# Patient Record
Sex: Male | Born: 1965
Health system: Southern US, Community
[De-identification: ages and names within clinical notes are randomized; demographics above are authoritative.]

## PROBLEM LIST (undated history)

## (undated) DIAGNOSIS — C8190 Hodgkin lymphoma, unspecified, unspecified site: Secondary | ICD-10-CM

## (undated) DIAGNOSIS — N200 Calculus of kidney: Secondary | ICD-10-CM

## (undated) DIAGNOSIS — I10 Essential (primary) hypertension: Secondary | ICD-10-CM

## (undated) HISTORY — PX: VASECTOMY: SHX75

## (undated) HISTORY — PX: BONE MARROW BIOPSY: SHX199

## (undated) HISTORY — DX: Calculus of kidney: N20.0

## (undated) HISTORY — DX: Hodgkin lymphoma, unspecified, unspecified site: C81.90

## (undated) HISTORY — DX: Essential (primary) hypertension: I10

---

## 2017-01-31 ENCOUNTER — Encounter: Payer: Self-pay | Admitting: *Deleted

## 2017-01-31 ENCOUNTER — Emergency Department
Admission: EM | Admit: 2017-01-31 | Discharge: 2017-02-01 | Disposition: A | Discharge status: Home / Self Care | Payer: Federal, State, Local not specified - PPO | Attending: Emergency Medicine | Admitting: Emergency Medicine

## 2017-01-31 DIAGNOSIS — N200 Calculus of kidney: Secondary | ICD-10-CM | POA: Insufficient documentation

## 2017-01-31 DIAGNOSIS — R1111 Vomiting without nausea: Secondary | ICD-10-CM | POA: Diagnosis not present

## 2017-01-31 DIAGNOSIS — R1032 Left lower quadrant pain: Secondary | ICD-10-CM | POA: Diagnosis present

## 2017-01-31 DIAGNOSIS — R109 Unspecified abdominal pain: Secondary | ICD-10-CM | POA: Diagnosis not present

## 2017-01-31 NOTE — ED Triage Notes (Signed)
Pt complains of left lower quad pain, pt denies fever , nausea and vomiting, pain started tonight, pt reports decrease in urine

## 2017-02-01 ENCOUNTER — Emergency Department: Payer: Federal, State, Local not specified - PPO

## 2017-02-01 DIAGNOSIS — R109 Unspecified abdominal pain: Secondary | ICD-10-CM | POA: Diagnosis not present

## 2017-02-01 DIAGNOSIS — R1111 Vomiting without nausea: Secondary | ICD-10-CM | POA: Diagnosis not present

## 2017-02-01 LAB — URINALYSIS, COMPLETE (UACMP) WITH MICROSCOPIC
BACTERIA UA: NONE SEEN
Bilirubin Urine: NEGATIVE
Glucose, UA: NEGATIVE mg/dL
Ketones, ur: NEGATIVE mg/dL
Leukocytes, UA: NEGATIVE
NITRITE: NEGATIVE
PROTEIN: NEGATIVE mg/dL
Specific Gravity, Urine: 1.018 (ref 1.005–1.030)
Squamous Epithelial / LPF: NONE SEEN
pH: 6 (ref 5.0–8.0)

## 2017-02-01 LAB — COMPREHENSIVE METABOLIC PANEL
ALBUMIN: 4.2 g/dL (ref 3.5–5.0)
ALT: 41 U/L (ref 17–63)
AST: 38 U/L (ref 15–41)
Alkaline Phosphatase: 59 U/L (ref 38–126)
Anion gap: 12 (ref 5–15)
BUN: 24 mg/dL — AB (ref 6–20)
CHLORIDE: 104 mmol/L (ref 101–111)
CO2: 23 mmol/L (ref 22–32)
CREATININE: 1.45 mg/dL — AB (ref 0.61–1.24)
Calcium: 9.3 mg/dL (ref 8.9–10.3)
GFR calc non Af Amer: 54 mL/min — ABNORMAL LOW (ref >60)
GLUCOSE: 144 mg/dL — AB (ref 65–99)
Potassium: 3.2 mmol/L — ABNORMAL LOW (ref 3.5–5.1)
SODIUM: 139 mmol/L (ref 135–145)
Total Bilirubin: 0.4 mg/dL (ref 0.3–1.2)
Total Protein: 7.2 g/dL (ref 6.5–8.1)

## 2017-02-01 LAB — CBC
HEMATOCRIT: 38.4 % — AB (ref 40.0–52.0)
Hemoglobin: 13.3 g/dL (ref 13.0–18.0)
MCH: 32.9 pg (ref 26.0–34.0)
MCHC: 34.6 g/dL (ref 32.0–36.0)
MCV: 95.2 fL (ref 80.0–100.0)
PLATELETS: 314 10*3/uL (ref 150–440)
RBC: 4.04 MIL/uL — ABNORMAL LOW (ref 4.40–5.90)
RDW: 12.6 % (ref 11.5–14.5)
WBC: 11.7 10*3/uL — AB (ref 3.8–10.6)

## 2017-02-01 LAB — LIPASE, BLOOD: Lipase: 36 U/L (ref 11–51)

## 2017-02-01 MED ORDER — MORPHINE SULFATE (PF) 4 MG/ML IV SOLN
4.0000 mg | Freq: Once | INTRAVENOUS | Status: AC
  Administered 2017-02-01: 4 mg via INTRAVENOUS
  Filled 2017-02-01: qty 1

## 2017-02-01 MED ORDER — ONDANSETRON 4 MG PO TBDP: 4.0000 mg | ORAL_TABLET | Freq: Three times a day (TID) | ORAL | 0 refills | Status: DC | PRN

## 2017-02-01 MED ORDER — KETOROLAC TROMETHAMINE 30 MG/ML IJ SOLN
30.0000 mg | Freq: Once | INTRAMUSCULAR | Status: AC
  Administered 2017-02-01: 30 mg via INTRAVENOUS
  Filled 2017-02-01: qty 1

## 2017-02-01 MED ORDER — ONDANSETRON HCL 4 MG/2ML IJ SOLN
4.0000 mg | Freq: Once | INTRAMUSCULAR | Status: AC
  Administered 2017-02-01: 4 mg via INTRAVENOUS
  Filled 2017-02-01: qty 2

## 2017-02-01 MED ORDER — OXYCODONE-ACETAMINOPHEN 5-325 MG PO TABS: 1.0000 | ORAL_TABLET | ORAL | 0 refills | Status: DC | PRN

## 2017-02-01 NOTE — ED Notes (Signed)

## 2017-02-01 NOTE — ED Notes (Signed)
Lab called for another recollect on lavender top. D/t insufficient samples from ED x2, this RN requested lab draw sample. Lab states will come draw.

## 2017-02-01 NOTE — ED Notes (Signed)
Pt to CT at this time.

## 2017-02-01 NOTE — ED Notes (Signed)
Unable to print label, lab called and per [place] ok to send chart label

## 2017-02-01 NOTE — ED Provider Notes (Signed)
[facility] Emergency Department Provider Note    First MD Initiated Contact with Patient 01/31/17 2348     (approximate)  I have reviewed the triage vital signs and the nursing notes.   HISTORY  Chief Complaint Abdominal Pain   HPI James Shaffer is a 51 y.o. male presents with acute onset of left lower quadrant abdominal pain associated with nausea and vomiting which began approximately an hour before presentation to the emergency department. Patient states that he's noted decreased urinary output however no dysuria no hematuria here patient denies any fever. Patient denies any diarrhea or constipation. She states his current pain score is 10 out of 10. Patient denies any history of kidney stones or diverticulitis.   Past medical history None There are no active problems to display for this patient.   Past surgical history:  None  Prior to Admission medications   Not on File    Allergies Patient has no known allergies.  No family history on file.  Social History Social History  Substance Use Topics  . Smoking status: Never Smoker  . Smokeless tobacco: Not on file  . Alcohol use 16.8 oz/week    28 Shots of liquor per week    Review of Systems Constitutional: No fever/chills Eyes: No visual changes. ENT: No sore throat. Cardiovascular: Denies chest pain. Respiratory: Denies shortness of breath. Gastrointestinal: No abdominal pain.  No nausea, no vomiting.  No diarrhea.  No constipation. Genitourinary: Negative for dysuria. Musculoskeletal: Negative for neck pain.  Negative for back pain. Integumentary: Negative for rash. Neurological: Negative for headaches, focal weakness or numbness.  ____________________________________________   PHYSICAL EXAM:  VITAL SIGNS: ED Triage Vitals  Enc Vitals Group     BP 01/31/17 2335 (!) 150/83     Pulse Rate 01/31/17 2335 63     Resp 01/31/17 2335 (!) 22     Temp 01/31/17 2335 97.6 F (36.4  C)     Temp Source 01/31/17 2335 Oral     SpO2 01/31/17 2335 100 %     Weight 01/31/17 2324 72.6 kg (160 lb)     Height 01/31/17 2324 1.753 m (5\' 9" )     Head Circumference --      Peak Flow --      Pain Score 01/31/17 2324 10     Pain Loc --      Pain Edu? --      Excl. in [place]? --     Constitutional: Alert and oriented. Apparent discomfort Eyes: Conjunctivae are normal. Head: Atraumatic. Mouth/Throat: Mucous membranes are moist.  Oropharynx non-erythematous. Neck: No stridor.   Cardiovascular: Normal rate, regular rhythm. Good peripheral circulation. Grossly normal heart sounds. Respiratory: Normal respiratory effort.  No retractions. Lungs CTAB. Gastrointestinal: Soft and nontender. No distention.  Musculoskeletal: No lower extremity tenderness nor edema. No gross deformities of extremities. Neurologic:  Normal speech and language. No gross focal neurologic deficits are appreciated.  Skin:  Skin is warm, dry and intact. No rash noted. Psychiatric: Mood and affect are normal. Speech and behavior are normal.  ____________________________________________   LABS (all labs ordered are listed, but only abnormal results are displayed)  Labs Reviewed  COMPREHENSIVE METABOLIC PANEL - Abnormal; Notable for the following:       Result Value   Potassium 3.2 (*)    Glucose, Bld 144 (*)    BUN 24 (*)    Creatinine, Ser 1.45 (*)    GFR calc non Af Amer 54 (*)  All other components within normal limits  URINALYSIS, COMPLETE (UACMP) WITH MICROSCOPIC - Abnormal; Notable for the following:    Color, Urine YELLOW (*)    APPearance CLEAR (*)    Hgb urine dipstick MODERATE (*)    All other components within normal limits  LIPASE, BLOOD  CBC     RADIOLOGY I, Trilby N BROWN, personally viewed and evaluated these images (plain radiographs) as part of my medical decision making, as well as reviewing the written report by the radiologist.  Ct Renal Stone Study  Result Date:  02/01/2017 CLINICAL DATA:  Left flank pain.  Nausea and vomiting. EXAM: CT ABDOMEN AND PELVIS WITHOUT CONTRAST TECHNIQUE: Multidetector CT imaging of the abdomen and pelvis was performed following the standard protocol without IV contrast. COMPARISON:  None. FINDINGS: Lower chest: The lung bases are clear. Hepatobiliary: No focal liver abnormality is seen. No gallstones, gallbladder wall thickening, or biliary dilatation. Pancreas: To small low-density lesions in the distal body and tail measure 4 and 5 mm respectively, too small to characterize. No ductal dilatation or inflammation. Spleen: Normal in size without focal abnormality. Adrenals/Urinary Tract: Obstructing 2 x 3 mm stone at the left ureterovesicular junction with mild to moderate hydroureteronephrosis. Mild left perinephric edema. There are least 4 nonobstructing stones in the left kidney. No right hydronephrosis. There are least 6 nonobstructing stones in the right kidney. Right ureter is decompressed. Urinary bladder is decompressed. Normal adrenal glands. Stomach/Bowel: Very minimal sigmoid colonic diverticulosis. No acute inflammation. Normal appendix. Small hiatal hernia. Vascular/Lymphatic: Trace aortic atherosclerosis without aneurysm. Circumaortic left renal vein. No adenopathy. Reproductive: Prostate is unremarkable. Other: No free air, free fluid, or intra-abdominal fluid collection. Musculoskeletal: There are no acute or suspicious osseous abnormalities. IMPRESSION: 1. Obstructing 2 x 3 mm stone at the left ureterovesicular junction with mild to moderate hydronephrosis. 2. Multiple bilateral nonobstructing renal calculi. 3. Minimal colonic diverticulosis.  Mild aortic atherosclerosis. Electronically Signed   By: Jeb Levering M.D.   On: 02/01/2017 00:57      Procedures   ____________________________________________   INITIAL IMPRESSION / ASSESSMENT AND PLAN / ED COURSE  Pertinent labs & imaging results that were available during  my care of the patient were reviewed by me and considered in my medical decision making (see chart for details).  51 year old male presenting with left lower quadrant abdominal pain that was not reproducible on exam. Given history and physical exam concern for possible ureterolithiasis which is confirmed with CT scan. Patient received IV morphine 4 mg on arrival to the emergency department as well as Zofran 4 mg with improvement of pain however not complete resolution. Following CT scan findings patient given IV Toradol 30 mg of complete resolution of pain.      ____________________________________________  FINAL CLINICAL IMPRESSION(S) / ED DIAGNOSES  Final diagnoses:  Kidney stone on left side     MEDICATIONS GIVEN DURING THIS VISIT:  Medications  morphine 4 MG/ML injection 4 mg (4 mg Intravenous Given 02/01/17 0019)  ondansetron (ZOFRAN) injection 4 mg (4 mg Intravenous Given 02/01/17 0019)  ketorolac (TORADOL) 30 MG/ML injection 30 mg (30 mg Intravenous Given 02/01/17 0112)     NEW OUTPATIENT MEDICATIONS STARTED DURING THIS VISIT:  New Prescriptions   No medications on file    Modified Medications   No medications on file    Discontinued Medications   No medications on file     Note:  This document was prepared using Dragon voice recognition software and may include unintentional dictation errors.  Gregor Hams, MD 02/01/17 [phone]

## 2017-02-18 NOTE — Progress Notes (Signed)
02/19/2017 2:35 PM   James Shaffer [birth year] [id number]  Referring provider: No referring provider defined for this encounter.  Chief Complaint  Patient presents with  . New Patient (Initial Visit)    ER referral for kidney stones    HPI: Patient is a 51 year old Caucasian male who [facility].  Patient states the onset of the pain was two weeks ago.   It was sharp.  It lasted for 30 minutes.  The pain was located left lower quadrant with no radiation.    The pain was a 10/10. Nothing made the pain better.   Nothing made the pain worse.  He did not have gross hematuria, fevers, chills, nausea or vomiting.    In the ED, he received Toradol, Zofran and morphine.  His UA noted TNTC RBC's.  His serum creatinine 1.45.  Prior serum creatinine not available.  WBC count was 11.7.    CT Renal stone study performed on 02/01/2017 in the ED noted an obstructing 2 x 3 mm stone at the left ureterovesicular junction with mild to moderate hydronephrosis.  Multiple bilateral nonobstructing renal calculi.   Minimal colonic diverticulosis.  Mild aortic atherosclerosis.  I have independently reviewed the films.  Today, he is having nocturia.  He passed a stone two days after the ED visit.  He has been having symptoms of GERD and HA's.  UA today is unremarkable.  He denies any further flank pain. He denies gross hematuria, dysuria and suprapubic pain. He has not had any recent fevers, chills, nausea or vomiting.Marland Kitchen  He does not have a prior history of stones.      PMH: Past Medical History:  Diagnosis Date  . Hodgkin's lymphoma [facility])     Surgical History: Past Surgical History:  Procedure Laterality Date  . BONE MARROW BIOPSY     hodgkins lymphoma  . VASECTOMY      Home Medications:  Allergies as of 02/19/2017   No Known Allergies     Medication List       Accurate as of 02/19/17  2:35 PM. Always use your most recent med list.            ibuprofen 200 MG tablet Commonly known as:  ADVIL,MOTRIN Take 200 mg by mouth every 6 (six) hours as needed.   omeprazole 40 MG capsule Commonly known as:  PRILOSEC Take 40 mg by mouth daily.   ondansetron 4 MG disintegrating tablet Commonly known as:  ZOFRAN ODT Take 1 tablet (4 mg total) by mouth every 8 (eight) hours as needed for nausea or vomiting.   OVER THE COUNTER MEDICATION Robitussin DM   oxyCODONE-acetaminophen 5-325 MG tablet Commonly known as:  ROXICET Take 1 tablet by mouth every 4 (four) hours as needed for severe pain.       Allergies: No Known Allergies  Family History: Family History  Problem Relation Age of Onset  . Kidney cancer Neg Hx   . Bladder Cancer Neg Hx   . Prostate cancer Neg Hx     Social History:  reports that he has quit smoking. He quit after 1.00 year of use. He has never used smokeless tobacco. He reports that he drinks about 16.8 oz of alcohol per week . He reports that he does not use drugs.  ROS: UROLOGY Frequent Urination?: No Hard to postpone urination?: No Burning/pain with urination?: No Get up at night to urinate?: Yes Leakage of urine?: No Urine stream starts and  stops?: No Trouble starting stream?: No Do you have to strain to urinate?: No Blood in urine?: No Urinary tract infection?: No Sexually transmitted disease?: No Injury to kidneys or bladder?: No Painful intercourse?: No Weak stream?: No Erection problems?: No Penile pain?: No  Gastrointestinal Nausea?: Yes Vomiting?: No Indigestion/heartburn?: Yes Diarrhea?: No Constipation?: No  Constitutional Fever: No Night sweats?: Yes Weight loss?: No Fatigue?: Yes  Skin Skin rash/lesions?: No Itching?: No  Eyes Blurred vision?: No Double vision?: No  Ears/Nose/Throat Sore throat?: Yes Sinus problems?: No  Hematologic/Lymphatic Swollen glands?: No Easy bruising?: No  Cardiovascular Leg swelling?: No Chest pain?: No  Respiratory Cough?:  Yes Shortness of breath?: No  Endocrine Excessive thirst?: No  Musculoskeletal Back pain?: No Joint pain?: No  Neurological Headaches?: Yes Dizziness?: No  Psychologic Depression?: No Anxiety?: No  Physical Exam: BP (!) 139/97   Pulse 92   Ht _0  (1.753 m)   Wt 161 lb 6.4 oz (73.2 kg)   BMI 23.83 kg/m   Constitutional: Well nourished. Alert and oriented, No acute distress. HEENT: [place] AT, moist mucus membranes. Trachea midline, no masses. Cardiovascular: No clubbing, cyanosis, or edema. Respiratory: Normal respiratory effort, no increased work of breathing. GI: Abdomen is soft, non tender, non distended, no abdominal masses. Liver and spleen not palpable.  No hernias appreciated.  Stool sample for occult testing is not indicated.   GU: No CVA tenderness.  No bladder fullness or masses.  Patient with circumcised phallus.  Urethral meatus is patent.  No penile discharge. No penile lesions or rashes. Scrotum without lesions, cysts, rashes and/or edema.  Testicles are located scrotally bilaterally. No masses are appreciated in the testicles. Left and right epididymis are normal. Rectal: Patient with  normal sphincter tone. Anus and perineum without scarring or rashes. No rectal masses are appreciated. Prostate is approximately 45 grams, no nodules are appreciated. Seminal vesicles are normal. Skin: No rashes, bruises or suspicious lesions. Lymph: No cervical or inguinal adenopathy. Neurologic: Grossly intact, no focal deficits, moving all 4 extremities. Psychiatric: Normal mood and affect.  Laboratory Data: Lab Results  Component Value Date   WBC 11.7 (H) 02/01/2017   HGB 13.3 02/01/2017   HCT 38.4 (L) 02/01/2017   MCV 95.2 02/01/2017   PLT 314 02/01/2017    Lab Results  Component Value Date   CREATININE 1.45 (H) 01/31/2017    Lab Results  Component Value Date   AST 38 01/31/2017   Lab Results  Component Value Date   ALT 41 01/31/2017      Urinalysis Unremarkable.  See EPIC.    Pertinent Imaging: CLINICAL DATA:  Left flank pain.  Nausea and vomiting.  EXAM: CT ABDOMEN AND PELVIS WITHOUT CONTRAST  TECHNIQUE: Multidetector CT imaging of the abdomen and pelvis was performed following the standard protocol without IV contrast.  COMPARISON:  None.  FINDINGS: Lower chest: The lung bases are clear.  Hepatobiliary: No focal liver abnormality is seen. No gallstones, gallbladder wall thickening, or biliary dilatation.  Pancreas: To small low-density lesions in the distal body and tail measure 4 and 5 mm respectively, too small to characterize. No ductal dilatation or inflammation.  Spleen: Normal in size without focal abnormality.  Adrenals/Urinary Tract: Obstructing 2 x 3 mm stone at the left ureterovesicular junction with mild to moderate hydroureteronephrosis. Mild left perinephric edema. There are least 4 nonobstructing stones in the left kidney. No right hydronephrosis. There are least 6 nonobstructing stones in the right kidney. Right ureter is decompressed. Urinary bladder  is decompressed. Normal adrenal glands.  Stomach/Bowel: Very minimal sigmoid colonic diverticulosis. No acute inflammation. Normal appendix. Small hiatal hernia.  Vascular/Lymphatic: Trace aortic atherosclerosis without aneurysm. Circumaortic left renal vein. No adenopathy.  Reproductive: Prostate is unremarkable.  Other: No free air, free fluid, or intra-abdominal fluid collection.  Musculoskeletal: There are no acute or suspicious osseous abnormalities.  IMPRESSION: 1. Obstructing 2 x 3 mm stone at the left ureterovesicular junction with mild to moderate hydronephrosis. 2. Multiple bilateral nonobstructing renal calculi. 3. Minimal colonic diverticulosis.  Mild aortic atherosclerosis.   Electronically Signed   By: Jeb Levering M.D.   On: 02/01/2017 00:57  Assessment & Plan:    1. Left ureteral  stone  - stone fragments sent for analysis - UA is unremarkable at today's visit  - Advised to contact our office or seek treatment in the ED if becomes febrile or pain/ vomiting are difficult control in order to arrange for emergent/urgent intervention  2. Left hydronephrosis  - obtain RUS to ensure the hydronephrosis has resolved  3. Microscopic hematuria  - UA today demonstrates no hematuria  - continue to monitor the patient's UA after the treatment/passage of the stone to ensure the hematuria has resolved  - if hematuria persists, we will pursue a hematuria workup with CT Urogram and cystoscopy if appropriate.  4. Bilateral nephrolithiasis  - no flank pain today  - offer 24 hour urine study once hydronephrosis has resolved    Return for RUS report.  These notes generated with voice recognition software. I apologize for typographical errors.  Zara Council, [facility] [address], [place], [place] [address] [phone]

## 2017-02-19 ENCOUNTER — Encounter: Payer: Self-pay | Admitting: Urology

## 2017-02-19 ENCOUNTER — Ambulatory Visit (INDEPENDENT_AMBULATORY_CARE_PROVIDER_SITE_OTHER): Payer: Federal, State, Local not specified - PPO | Admitting: Urology

## 2017-02-19 DIAGNOSIS — N201 Calculus of ureter: Secondary | ICD-10-CM

## 2017-02-19 DIAGNOSIS — N200 Calculus of kidney: Secondary | ICD-10-CM

## 2017-02-19 DIAGNOSIS — N132 Hydronephrosis with renal and ureteral calculous obstruction: Secondary | ICD-10-CM

## 2017-02-19 DIAGNOSIS — R3129 Other microscopic hematuria: Secondary | ICD-10-CM

## 2017-02-19 LAB — URINALYSIS, COMPLETE
Bilirubin, UA: NEGATIVE
Glucose, UA: NEGATIVE
LEUKOCYTES UA: NEGATIVE
Nitrite, UA: NEGATIVE
PH UA: 5.5 (ref 5.0–7.5)
RBC, UA: NEGATIVE
Specific Gravity, UA: 1.02 (ref 1.005–1.030)
Urobilinogen, Ur: 1 mg/dL (ref 0.2–1.0)

## 2017-02-22 LAB — CULTURE, URINE COMPREHENSIVE

## 2017-03-05 ENCOUNTER — Ambulatory Visit
Admission: RE | Admit: 2017-03-05 | Discharge: 2017-03-05 | Disposition: A | Discharge status: Home / Self Care | Payer: Federal, State, Local not specified - PPO | Source: Ambulatory Visit | Attending: Urology | Admitting: Urology

## 2017-03-05 DIAGNOSIS — N200 Calculus of kidney: Secondary | ICD-10-CM | POA: Insufficient documentation

## 2017-03-05 DIAGNOSIS — N132 Hydronephrosis with renal and ureteral calculous obstruction: Secondary | ICD-10-CM

## 2017-03-13 NOTE — Progress Notes (Signed)
03/14/2017 1:41 PM   James Shaffer May 31, 1966 [id number]  Referring provider: No referring provider defined for this encounter.  Chief Complaint  Patient presents with  . Nephrolithiasis    RUS results    HPI: [name] who presents today to review his renal ultrasound.  Background history Patient is a 51 year old Caucasian male who is referred by [facility] ED for nephrolithiasis.  Patient states the onset of the pain was two weeks ago.   It was sharp.  It lasted for 30 minutes.  The pain was located left lower quadrant with no radiation.  The pain was a 10/10. Nothing made the pain better.   Nothing made the pain worse.  He did not have gross hematuria, fevers, chills, nausea or vomiting.  In the ED, he received Toradol, Zofran and morphine.  His UA noted TNTC RBC's.  His serum creatinine 1.45.  Prior serum creatinine not available.  WBC count was 11.7.  CT Renal stone study performed on 02/01/2017 in the ED noted an obstructing 2 x 3 mm stone at the left ureterovesicular junction with mild to moderate hydronephrosis.  Multiple bilateral nonobstructing renal calculi.   Minimal colonic diverticulosis.  Mild aortic atherosclerosis.  I have independently reviewed the films.  Today, he is having nocturia.  He passed a stone two days after the ED visit.  He has been having symptoms of GERD and HA's.  UA today is unremarkable.  He denies any further flank pain. He denies gross hematuria, dysuria and suprapubic pain. He has not had any recent fevers, chills, nausea or vomiting.  He does not have a prior history of stones.    RUS completed on 03/05/2017 noted no left hydronephrosis is visualized  Multiple echogenic foci within the kidneys consistent with intrarenal calculi.  Today, he is complaining of nocturia and feelings of incomplete emptying. He is not having flank pain, gross hematuria or suprapubic pain. He is not experiencing dysuria, fever, chills, nausea or vomiting.  His UA today  demonstrates 3-10 RBC's.     PMH: Past Medical History:  Diagnosis Date  . Hodgkin's lymphoma [facility])     Surgical History: Past Surgical History:  Procedure Laterality Date  . BONE MARROW BIOPSY     hodgkins lymphoma  . VASECTOMY      Home Medications:  Allergies as of 03/14/2017   No Known Allergies     Medication List       Accurate as of 03/14/17  1:41 PM. Always use your most recent med list.          ibuprofen 200 MG tablet Commonly known as:  ADVIL,MOTRIN Take 200 mg by mouth every 6 (six) hours as needed.   omeprazole 40 MG capsule Commonly known as:  PRILOSEC Take 40 mg by mouth daily.   ondansetron 4 MG disintegrating tablet Commonly known as:  ZOFRAN ODT Take 1 tablet (4 mg total) by mouth every 8 (eight) hours as needed for nausea or vomiting.   OVER THE COUNTER MEDICATION Robitussin DM   oxyCODONE-acetaminophen 5-325 MG tablet Commonly known as:  ROXICET Take 1 tablet by mouth every 4 (four) hours as needed for severe pain.       Allergies: No Known Allergies  Family History: Family History  Problem Relation Age of Onset  . Kidney cancer Neg Hx   . Bladder Cancer Neg Hx   . Prostate cancer Neg Hx     Social History:  reports that he has quit  smoking. He quit after 1.00 year of use. He has never used smokeless tobacco. He reports that he drinks about 16.8 oz of alcohol per week . He reports that he does not use drugs.  ROS: UROLOGY Frequent Urination?: No Hard to postpone urination?: No Burning/pain with urination?: No Get up at night to urinate?: Yes Leakage of urine?: No Urine stream starts and stops?: No Trouble starting stream?: No Do you have to strain to urinate?: No Blood in urine?: No Urinary tract infection?: No Sexually transmitted disease?: No Injury to kidneys or bladder?: No Painful intercourse?: No Weak stream?: No Erection problems?: No Penile pain?: No  Gastrointestinal Nausea?: No Vomiting?:  No Indigestion/heartburn?: No Diarrhea?: No Constipation?: No  Constitutional Fever: No Night sweats?: No Weight loss?: No Fatigue?: Yes  Skin Skin rash/lesions?: No Itching?: No  Eyes Blurred vision?: No Double vision?: No  Ears/Nose/Throat Sore throat?: No Sinus problems?: No  Hematologic/Lymphatic Swollen glands?: No Easy bruising?: No  Cardiovascular Leg swelling?: No Chest pain?: No  Respiratory Cough?: No Shortness of breath?: No  Endocrine Excessive thirst?: No  Musculoskeletal Back pain?: No Joint pain?: No  Neurological Headaches?: Yes Dizziness?: No  Psychologic Depression?: No Anxiety?: No  Physical Exam: BP 129/76 (BP Location: Left Arm, Patient Position: Sitting, Cuff Size: Normal)   Pulse 73   Ht _0  (1.753 m)   Wt 160 lb 3.2 oz (72.7 kg)   BMI 23.66 kg/m   Constitutional: Well nourished. Alert and oriented, No acute distress. HEENT: [place] AT, moist mucus membranes. Trachea midline, no masses. Cardiovascular: No clubbing, cyanosis, or edema. Respiratory: Normal respiratory effort, no increased work of breathing. Skin: No rashes, bruises or suspicious lesions. Lymph: No cervical or inguinal adenopathy. Neurologic: Grossly intact, no focal deficits, moving all 4 extremities. Psychiatric: Normal mood and affect.  Laboratory Data: Lab Results  Component Value Date   WBC 11.7 (H) 02/01/2017   HGB 13.3 02/01/2017   HCT 38.4 (L) 02/01/2017   MCV 95.2 02/01/2017   PLT 314 02/01/2017    Lab Results  Component Value Date   CREATININE 1.45 (H) 01/31/2017    Lab Results  Component Value Date   AST 38 01/31/2017   Lab Results  Component Value Date   ALT 41 01/31/2017   I have reviewed the labs    Pertinent Imaging: CLINICAL DATA:  Follow-up hydronephrosis  EXAM: RENAL / URINARY TRACT ULTRASOUND COMPLETE  COMPARISON:  CT 02/01/2017  FINDINGS: Right Kidney:  Length: 10.2 cm. Echogenicity within normal limits.  No mass or hydronephrosis visualized. Multiple echodense foci, measuring up to 5 mm consistent with stones.  Left Kidney:  Length: 10.7 cm. Echogenicity within normal limits. No mass or hydronephrosis visualized. Multiple echodense foci consistent with stones, measuring up to 8 mm.  Bladder:  Appears normal for degree of bladder distention. Prominent prostate gland  IMPRESSION: 1. No left hydronephrosis is visualized 2. Multiple echogenic foci within the kidneys consistent with intrarenal calculi   Electronically Signed   By: Donavan Foil M.D.   On: 03/06/2017 02:09  Assessment & Plan:    1. Left ureteral stone  - passed spontaneously  2. Left hydronephrosis  -  the hydronephrosis has resolved  3. Bilateral nephrolithiasis  - no flank pain today  - 24 hour urine study ordered  4. Nocturia  - recent symptom  - UA notes 3-10 RBC's - will send for culture   Return for RTC for [employer].  These notes generated with voice recognition software. I apologize  for typographical errors.  Zara Council, [facility] [address] [place],  [address] [phone]

## 2017-03-14 ENCOUNTER — Encounter: Payer: Self-pay | Admitting: Urology

## 2017-03-14 ENCOUNTER — Ambulatory Visit (INDEPENDENT_AMBULATORY_CARE_PROVIDER_SITE_OTHER): Payer: Federal, State, Local not specified - PPO | Admitting: Urology

## 2017-03-14 VITALS — BP 129/76 | HR 73 | Ht 69.0 in | Wt 160.2 lb

## 2017-03-14 DIAGNOSIS — N200 Calculus of kidney: Secondary | ICD-10-CM

## 2017-03-14 DIAGNOSIS — N201 Calculus of ureter: Secondary | ICD-10-CM

## 2017-03-14 DIAGNOSIS — N132 Hydronephrosis with renal and ureteral calculous obstruction: Secondary | ICD-10-CM

## 2017-03-14 DIAGNOSIS — R351 Nocturia: Secondary | ICD-10-CM

## 2017-03-14 LAB — URINALYSIS, COMPLETE
BILIRUBIN UA: NEGATIVE
GLUCOSE, UA: NEGATIVE
KETONES UA: NEGATIVE
Leukocytes, UA: NEGATIVE
NITRITE UA: NEGATIVE
SPEC GRAV UA: 1.025 (ref 1.005–1.030)
UUROB: 0.2 mg/dL (ref 0.2–1.0)
pH, UA: 6 (ref 5.0–7.5)

## 2017-03-14 LAB — MICROSCOPIC EXAMINATION
Bacteria, UA: NONE SEEN
Epithelial Cells (non renal): NONE SEEN /hpf (ref 0–10)
WBC UA: NONE SEEN /HPF (ref 0–?)

## 2017-03-17 LAB — CULTURE, URINE COMPREHENSIVE

## 2017-03-19 ENCOUNTER — Telehealth: Payer: Self-pay

## 2017-03-19 NOTE — Telephone Encounter (Signed)
Called patient to give lab results. No answer. Left vmail per DPR.  

## 2017-03-19 NOTE — Telephone Encounter (Signed)
-----   Message from Nori Riis, PA-C sent at 03/18/2017  2:52 PM EDT ----- Please let Mr. Kinker know that his urine culture was negative

## 2018-10-01 IMAGING — CT CT RENAL STONE PROTOCOL
3 of 4 series · 8 of 46 positions shown, 15 images · non-contrast
Comparison: None.

CLINICAL DATA: Left flank pain.  Nausea and vomiting.

EXAM:
CT ABDOMEN AND PELVIS WITHOUT CONTRAST
TECHNIQUE: Multidetector CT imaging of the abdomen and pelvis was performed
following the standard protocol without IV contrast.

[Series 4: lung bases · axial · 0.77mm/px · z∈[-435,-375]mm · 4 of 21 slices shown, 9 images]
[im 5/21  soft-tissue]
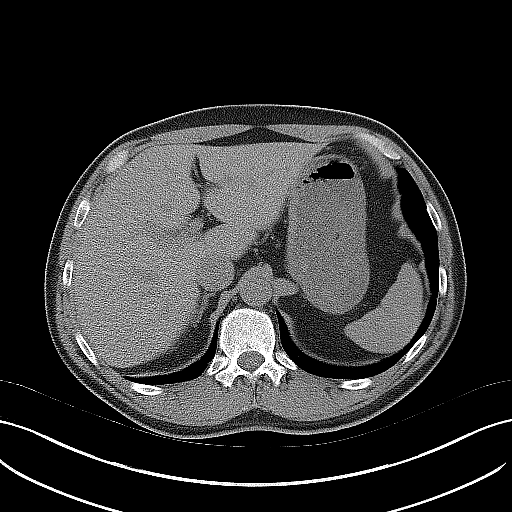
[im 5/21  lung]
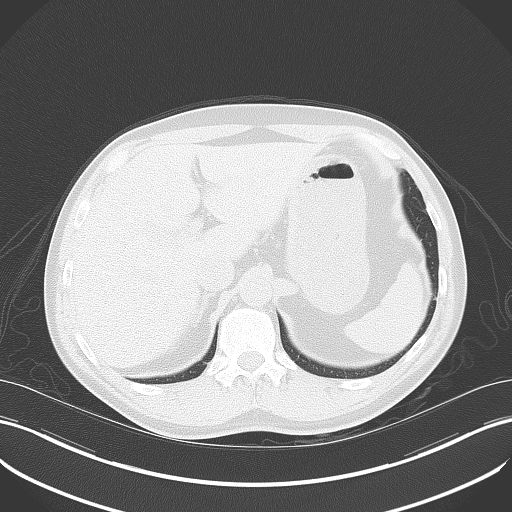
[im 5/21  bone]
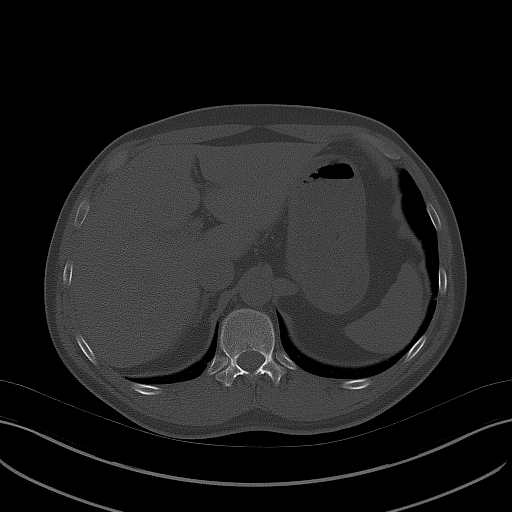
[im 9/21  soft-tissue]
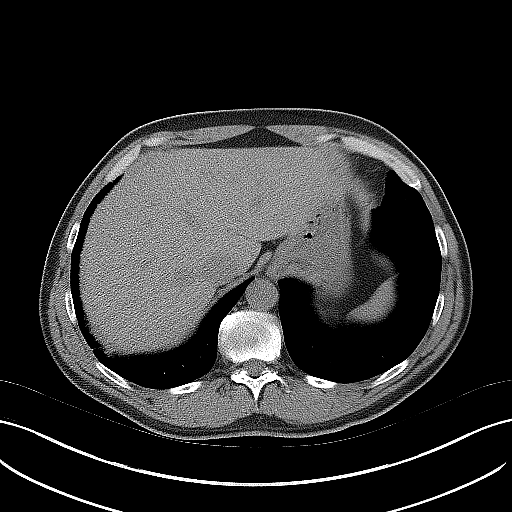
[im 9/21  lung]
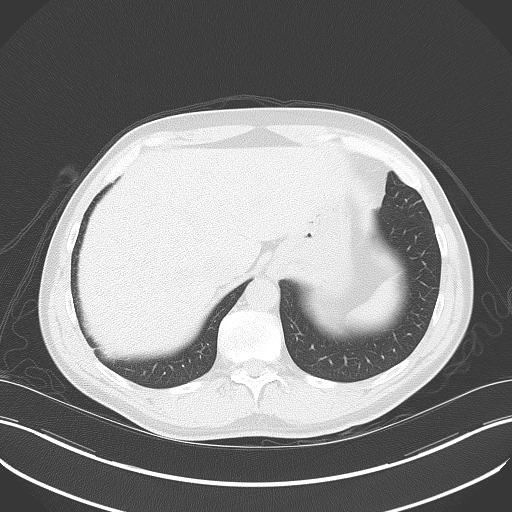
[im 13/21  soft-tissue]
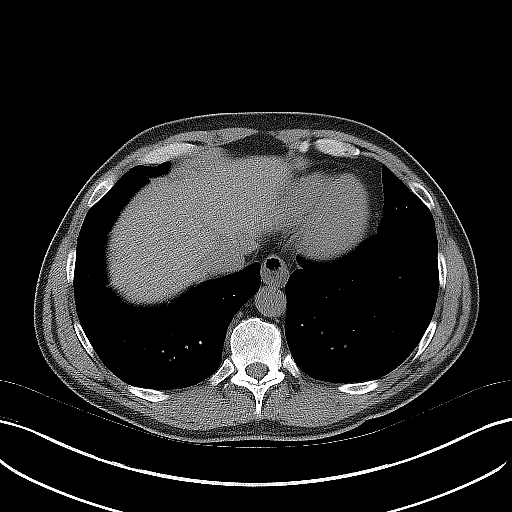
[im 13/21  lung]
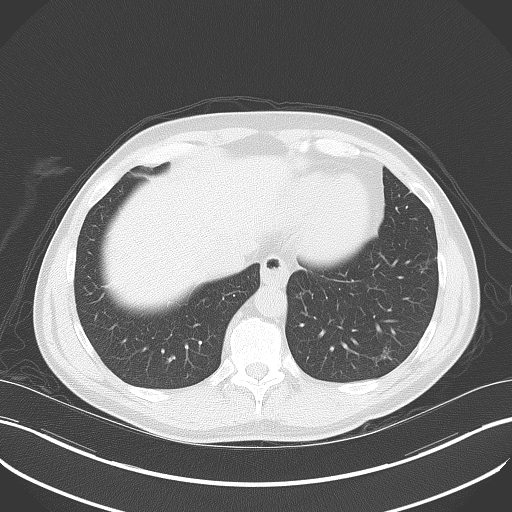
[im 17/21  soft-tissue]
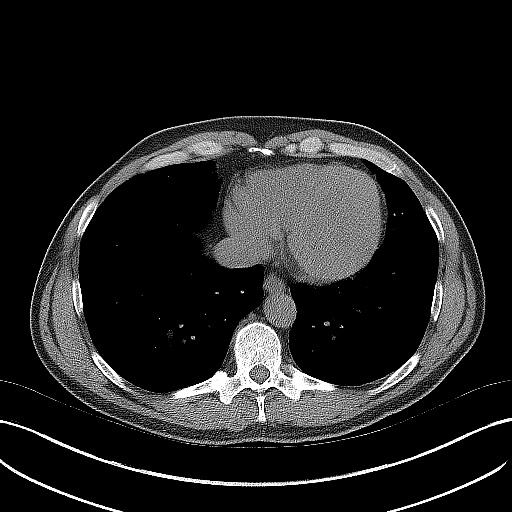
[im 17/21  lung]
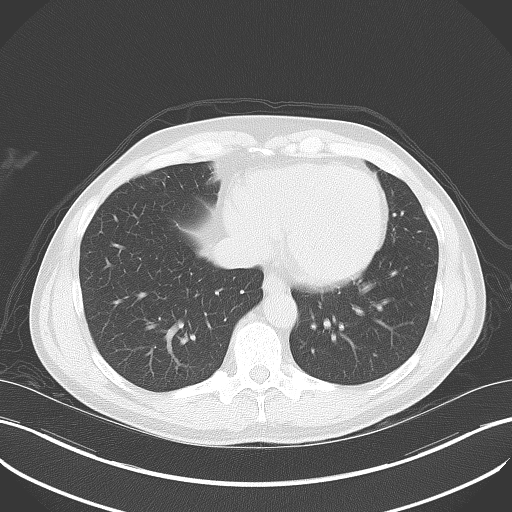

[Series 5: coronal · coronal · 0.73mm/px · 3 of 124 slices shown, 4 images]
[im 42/124  soft-tissue]
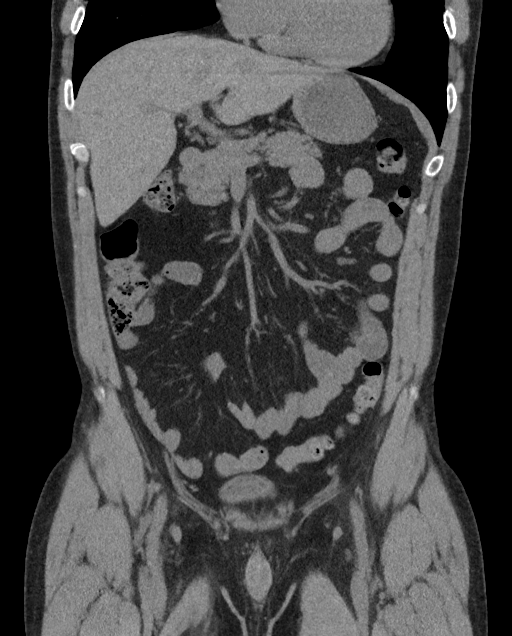
[im 55/124  soft-tissue]
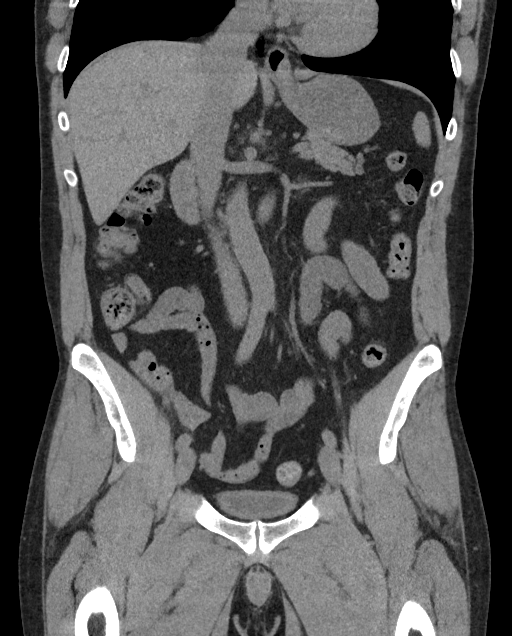
[im 55/124  bone]
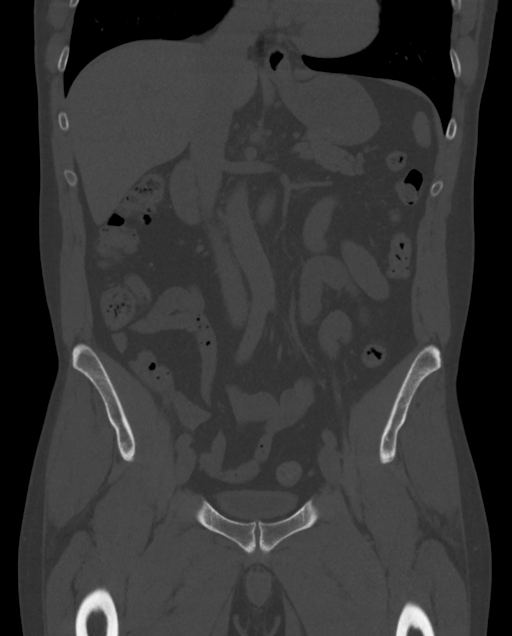
[im 69/124  soft-tissue]
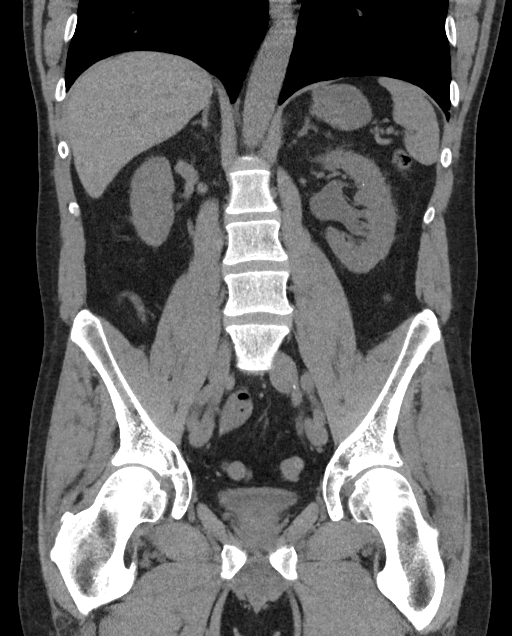

[Series 6: sagittal · sagittal · 0.51mm/px · 1 of 165 slices shown, 2 images]
[im 55/165  soft-tissue]
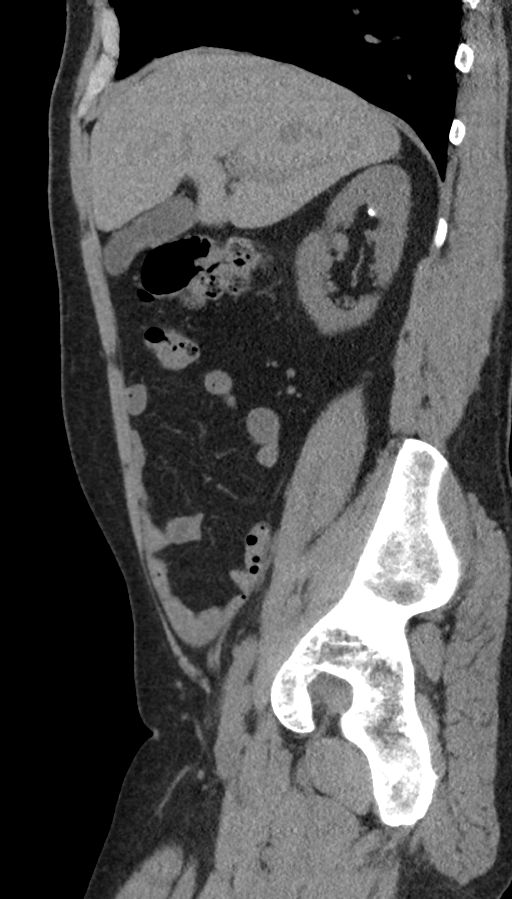
[im 55/165  bone]
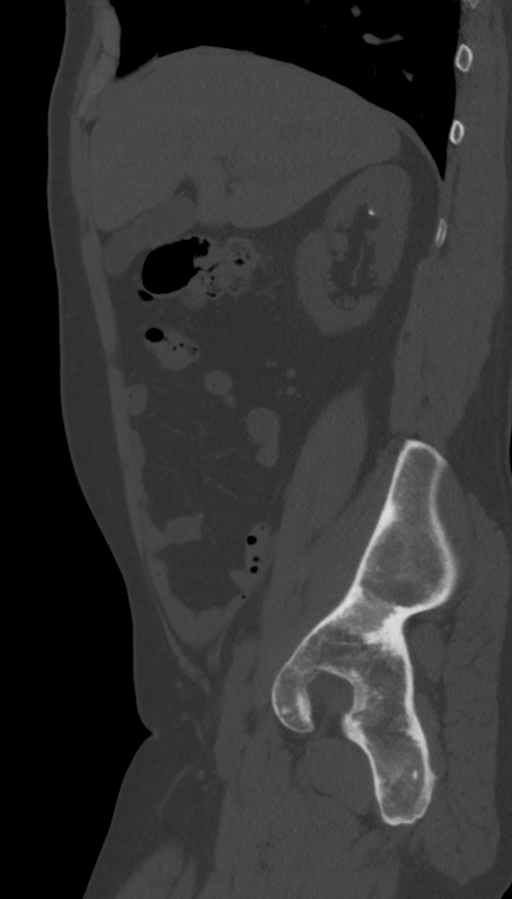

[8 of 46 positions shown; findings below may reference images not displayed]

FINDINGS: Lower chest: The lung bases are clear.

Hepatobiliary: No focal liver abnormality is seen. No gallstones,
gallbladder wall thickening, or biliary dilatation.

Pancreas: To small low-density lesions in the distal body and tail
measure 4 and 5 mm respectively, too small to characterize. No
ductal dilatation or inflammation.

Spleen: Normal in size without focal abnormality.

Adrenals/Urinary Tract: Obstructing 2 x 3 mm stone at the left
ureterovesicular junction with mild to moderate
hydroureteronephrosis. Mild left perinephric edema. There are least
4 nonobstructing stones in the left kidney. No right hydronephrosis.
There are least 6 nonobstructing stones in the right kidney. Right
ureter is decompressed. Urinary bladder is decompressed. Normal
adrenal glands.

Stomach/Bowel: Very minimal sigmoid colonic diverticulosis. No acute
inflammation. Normal appendix. Small hiatal hernia.

Vascular/Lymphatic: Trace aortic atherosclerosis without aneurysm.
Circumaortic left renal vein. No adenopathy.

Reproductive: Prostate is unremarkable.

Other: No free air, free fluid, or intra-abdominal fluid collection.

Musculoskeletal: There are no acute or suspicious osseous
abnormalities.
IMPRESSION: 1. Obstructing 2 x 3 mm stone at the left ureterovesicular junction
with mild to moderate hydronephrosis.
2. Multiple bilateral nonobstructing renal calculi.
3. Minimal colonic diverticulosis.  Mild aortic atherosclerosis.

## 2018-10-18 ENCOUNTER — Encounter: Payer: Self-pay | Admitting: Family Medicine

## 2018-10-18 ENCOUNTER — Ambulatory Visit: Payer: Federal, State, Local not specified - PPO | Admitting: Family Medicine

## 2018-10-18 VITALS — BP 150/104 | HR 81 | Temp 97.8°F | Ht 69.0 in | Wt 172.5 lb

## 2018-10-18 DIAGNOSIS — Z0000 Encounter for general adult medical examination without abnormal findings: Secondary | ICD-10-CM

## 2018-10-18 DIAGNOSIS — Z23 Encounter for immunization: Secondary | ICD-10-CM | POA: Diagnosis not present

## 2018-10-18 DIAGNOSIS — Z113 Encounter for screening for infections with a predominantly sexual mode of transmission: Secondary | ICD-10-CM

## 2018-10-18 DIAGNOSIS — Z1211 Encounter for screening for malignant neoplasm of colon: Secondary | ICD-10-CM | POA: Diagnosis not present

## 2018-10-18 DIAGNOSIS — R030 Elevated blood-pressure reading, without diagnosis of hypertension: Secondary | ICD-10-CM | POA: Diagnosis not present

## 2018-10-18 DIAGNOSIS — Z7689 Persons encountering health services in other specified circumstances: Secondary | ICD-10-CM | POA: Diagnosis not present

## 2018-10-18 NOTE — Progress Notes (Deleted)
   BP (!) 170/102 (BP Location: Right Arm, Cuff Size: Normal)   Pulse 81   Temp 97.8 F (36.6 C) (Oral)   Ht 5\' 9"  (1.753 m)   Wt 172 lb 8 oz (78.2 kg)   SpO2 97%   BMI 25.47 kg/m    Subjective:    Patient ID: James Shaffer, male    DOB: May 15, 1966, 53 y.o.   MRN: [id number]  HPI: James Shaffer is a 53 y.o. male  Chief Complaint  Patient presents with  . Establish Care    no concerns    Here today to establish care.   Hodgkin's lymphoma dx'd at age 43 and treated with chemo for 1.5 years, in remission.   No other known medical issues.   Last CPE was at least 3-4 years ago. Here today because he has paperwork to apply for life insurance.   Relevant past medical, surgical, family and social history reviewed and updated as indicated. Interim medical history since our last visit reviewed. Allergies and medications reviewed and updated.  Review of Systems  Per HPI unless specifically indicated above     Objective:    BP (!) 170/102 (BP Location: Right Arm, Cuff Size: Normal)   Pulse 81   Temp 97.8 F (36.6 C) (Oral)   Ht 5\' 9"  (1.753 m)   Wt 172 lb 8 oz (78.2 kg)   SpO2 97%   BMI 25.47 kg/m   Wt Readings from Last 3 Encounters:  10/18/18 172 lb 8 oz (78.2 kg)  03/14/17 160 lb 3.2 oz (72.7 kg)  02/19/17 161 lb 6.4 oz (73.2 kg)    Physical Exam  Results for orders placed or performed in visit on 03/14/17  CULTURE, URINE COMPREHENSIVE  Result Value Ref Range   Urine Culture, Comprehensive Final report    Organism ID, Bacteria Comment   Microscopic Examination  Result Value Ref Range   WBC, UA None seen 0 - 5 /hpf   RBC, UA 3-10 (A) 0 - 2 /hpf   Epithelial Cells (non renal) None seen 0 - 10 /hpf   Casts Present (A) None seen /lpf   Cast Type Hyaline casts N/A   Mucus, UA Present (A) Not Estab.   Bacteria, UA None seen None seen/Few  Urinalysis, Complete  Result Value Ref Range   Specific Gravity, UA 1.025 1.005 - 1.030   pH, UA 6.0 5.0 - 7.5   Color, UA  Yellow Yellow   Appearance Ur Clear Clear   Leukocytes, UA Negative Negative   Protein, UA Trace (A) Negative/Trace   Glucose, UA Negative Negative   Ketones, UA Negative Negative   RBC, UA 2+ (A) Negative   Bilirubin, UA Negative Negative   Urobilinogen, Ur 0.2 0.2 - 1.0 mg/dL   Nitrite, UA Negative Negative   Microscopic Examination See below:       Assessment & Plan:   Problem List Items Addressed This Visit    None       Follow up plan: No follow-ups on file.

## 2018-10-18 NOTE — Patient Instructions (Signed)
Td Vaccine (Tetanus and Diphtheria): What You Need to Know 1. Why get vaccinated? Tetanus  and diphtheria are very serious diseases. They are rare in the [place] today, but people who do become infected often have severe complications. Td vaccine is used to protect adolescents and adults from both of these diseases. Both tetanus and diphtheria are infections caused by bacteria. Diphtheria spreads from person to person through coughing or sneezing. Tetanus-causing bacteria enter the body through cuts, scratches, or wounds. TETANUS (Lockjaw) causes painful muscle tightening and stiffness, usually all over the body.  It can lead to tightening of muscles in the head and neck so you can't open your mouth, swallow, or sometimes even breathe. Tetanus kills about 1 out of every 10 people who are infected even after receiving the best medical care. DIPHTHERIA can cause a thick coating to form in the back of the throat.  It can lead to breathing problems, paralysis, heart failure, and death. Before vaccines, as many as 200,000 cases of diphtheria and hundreds of cases of tetanus were reported in the [place] each year. Since vaccination began, reports of cases for both diseases have dropped by about 99%. 2. Td vaccine Td vaccine can protect adolescents and adults from tetanus and diphtheria. Td is usually given as a booster dose every 10 years but it can also be given earlier after a severe and dirty wound or burn. Another vaccine, called Tdap, which protects against pertussis in addition to tetanus and diphtheria, is sometimes recommended instead of Td vaccine. Your doctor or the person giving you the vaccine can give you more information. Td may safely be given at the same time as other vaccines. 3. Some people should not get this vaccine  A person who has ever had a life-threatening allergic reaction after a previous dose of any tetanus or diphtheria containing vaccine, OR has a severe allergy  to any part of this vaccine, should not get Td vaccine. Tell the person giving the vaccine about any severe allergies.  Talk to your doctor if you: ? had severe pain or swelling after any vaccine containing diphtheria or tetanus, ? ever had a condition called Guillain Barr Syndrome (GBS), ? aren't feeling well on the day the shot is scheduled. 4. Risks of a vaccine reaction With any medicine, including vaccines, there is a chance of side effects. These are usually mild and go away on their own. Serious reactions are also possible but are rare. Most people who get Td vaccine do not have any problems with it. Mild Problems following Td vaccine: (Did not interfere with activities)  Pain where the shot was given (about 8 people in 10)  Redness or swelling where the shot was given (about 1 person in 4)  Mild fever (rare)  Headache (about 1 person in 4)  Tiredness (about 1 person in 4) Moderate Problems following Td vaccine: (Interfered with activities, but did not require medical attention)  Fever over 102F (rare) Severe Problems following Td vaccine: (Unable to perform usual activities; required medical attention)  Swelling, severe pain, bleeding and/or redness in the arm where the shot was given (rare). Problems that could happen after any vaccine:  People sometimes faint after a medical procedure, including vaccination. Sitting or lying down for about 15 minutes can help prevent fainting, and injuries caused by a fall. Tell your doctor if you feel dizzy, or have vision changes or ringing in the ears.  Some people get severe pain in the shoulder and have   difficulty moving the arm where a shot was given. This happens very rarely.  Any medication can cause a severe allergic reaction. Such reactions from a vaccine are very rare, estimated at fewer than 1 in a million doses, and would happen within a few minutes to a few hours after the vaccination. As with any medicine, there is a  very remote chance of a vaccine causing a serious injury or death. The safety of vaccines is always being monitored. For more information, visit: www.cdc.gov/vaccinesafety/ 5. What if there is a serious reaction? What should I look for?  Look for anything that concerns you, such as signs of a severe allergic reaction, very high fever, or unusual behavior. Signs of a severe allergic reaction can include hives, swelling of the face and throat, difficulty breathing, a fast heartbeat, dizziness, and weakness. These would usually start a few minutes to a few hours after the vaccination. What should I do?  If you think it is a severe allergic reaction or other emergency that can't wait, call 9-1-1 or get the person to the nearest hospital. Otherwise, call your doctor.  Afterward, the reaction should be reported to the Vaccine Adverse Event Reporting System (VAERS). Your doctor might file this report, or you can do it yourself through the VAERS web site at www.vaers.hhs.gov, or by calling 1-800-822-7967. VAERS does not give medical advice. 6. The National Vaccine Injury Compensation Program The National Vaccine Injury Compensation Program (VICP) is a federal program that was created to compensate people who may have been injured by certain vaccines. Persons who believe they may have been injured by a vaccine can learn about the program and about filing a claim by calling 1-800-338-2382 or visiting the VICP website at www.hrsa.gov/vaccinecompensation. There is a time limit to file a claim for compensation. 7. How can I learn more?  Ask your doctor. He or she can give you the vaccine package insert or suggest other sources of information.  Call your local or state health department.  Contact the Centers for Disease Control and Prevention (CDC): ? Call 1-800-232-4636 (1-800-CDC-INFO) ? Visit CDC's website at www.cdc.gov/vaccines Vaccine Information Statement Td Vaccine (12/07/15) This information is  not intended to replace advice given to you by your health care provider. Make sure you discuss any questions you have with your health care provider. Document Released: 06/11/2006 Document Revised: 04/01/2018 Document Reviewed: 04/01/2018 Elsevier Interactive Patient Education  2019 [employer]  

## 2018-10-19 DIAGNOSIS — I10 Essential (primary) hypertension: Secondary | ICD-10-CM | POA: Insufficient documentation

## 2018-10-19 LAB — LIPID PANEL W/O CHOL/HDL RATIO
Cholesterol, Total: 211 mg/dL — ABNORMAL HIGH (ref 100–199)
HDL: 80 mg/dL (ref >39)
LDL Calculated: 107 mg/dL — ABNORMAL HIGH (ref 0–99)
Triglycerides: 122 mg/dL (ref 0–149)
VLDL Cholesterol Cal: 24 mg/dL (ref 5–40)

## 2018-10-19 LAB — CBC WITH DIFFERENTIAL/PLATELET
Basophils Absolute: 0 10*3/uL (ref 0.0–0.2)
Basos: 1 %
EOS (ABSOLUTE): 0.1 10*3/uL (ref 0.0–0.4)
Eos: 1 %
HEMOGLOBIN: 15 g/dL (ref 13.0–17.7)
Hematocrit: 41.9 % (ref 37.5–51.0)
IMMATURE GRANS (ABS): 0 10*3/uL (ref 0.0–0.1)
IMMATURE GRANULOCYTES: 0 %
LYMPHS: 22 %
Lymphocytes Absolute: 1.4 10*3/uL (ref 0.7–3.1)
MCH: 33.6 pg — AB (ref 26.6–33.0)
MCHC: 35.8 g/dL — ABNORMAL HIGH (ref 31.5–35.7)
MCV: 94 fL (ref 79–97)
MONOCYTES: 7 %
Monocytes Absolute: 0.4 10*3/uL (ref 0.1–0.9)
NEUTROS ABS: 4.4 10*3/uL (ref 1.4–7.0)
NEUTROS PCT: 69 %
PLATELETS: 331 10*3/uL (ref 150–450)
RBC: 4.46 x10E6/uL (ref 4.14–5.80)
RDW: 12.3 % (ref 11.6–15.4)
WBC: 6.4 10*3/uL (ref 3.4–10.8)

## 2018-10-19 LAB — COMPREHENSIVE METABOLIC PANEL
ALBUMIN: 4.8 g/dL (ref 3.8–4.9)
ALT: 50 IU/L — AB (ref 0–44)
AST: 44 IU/L — ABNORMAL HIGH (ref 0–40)
Albumin/Globulin Ratio: 1.5 (ref 1.2–2.2)
Alkaline Phosphatase: 90 IU/L (ref 39–117)
BUN / CREAT RATIO: 15 (ref 9–20)
BUN: 22 mg/dL (ref 6–24)
Bilirubin Total: 0.5 mg/dL (ref 0.0–1.2)
CALCIUM: 10.4 mg/dL — AB (ref 8.7–10.2)
CO2: 18 mmol/L — ABNORMAL LOW (ref 20–29)
CREATININE: 1.42 mg/dL — AB (ref 0.76–1.27)
Chloride: 102 mmol/L (ref 96–106)
GFR calc Af Amer: 65 mL/min/{1.73_m2} (ref >59)
GFR, EST NON AFRICAN AMERICAN: 56 mL/min/{1.73_m2} — AB (ref >59)
Globulin, Total: 3.2 g/dL (ref 1.5–4.5)
Glucose: 111 mg/dL — ABNORMAL HIGH (ref 65–99)
POTASSIUM: 4.9 mmol/L (ref 3.5–5.2)
SODIUM: 142 mmol/L (ref 134–144)
TOTAL PROTEIN: 8 g/dL (ref 6.0–8.5)

## 2018-10-19 LAB — HSV(HERPES SIMPLEX VRS) I + II AB-IGG
HSV 1 GLYCOPROTEIN G AB, IGG: 13 {index} — AB (ref 0.00–0.90)
HSV 2 IGG, TYPE SPEC: 13.3 {index} — AB (ref 0.00–0.90)

## 2018-10-19 LAB — RPR: RPR: NONREACTIVE

## 2018-10-19 LAB — HIV ANTIBODY (ROUTINE TESTING W REFLEX): HIV SCREEN 4TH GENERATION: NONREACTIVE

## 2018-10-19 LAB — TSH: TSH: 1.06 u[IU]/mL (ref 0.450–4.500)

## 2018-10-19 NOTE — Assessment & Plan Note (Signed)
DASH diet, exercise,stress reduction. Recheck in 1 week

## 2018-10-19 NOTE — Progress Notes (Signed)
BP (!) 150/104   Pulse 81   Temp 97.8 F (36.6 C) (Oral)   Ht _0  (1.753 m)   Wt 172 lb 8 oz (78.2 kg)   SpO2 97%   BMI 25.47 kg/m    Subjective:    Patient ID: James Shaffer, male    DOB: [birth year], 53 y.o.   MRN: [id number]  HPI: James Shaffer is a 53 y.o. male presenting on 10/18/2018 for comprehensive medical examination. Current medical complaints include:none  Here today to establish care.   Hodgkin's lymphoma dx'd at age 67 and treated with chemo for 1.5 years, in remission.   No other known medical issues.   Last CPE was at least 3-4 years ago. Here today because he has paperwork to apply for life insurance.    He currently lives with: Interim Problems from his last visit: no  Depression Screen done today and results listed below:  Depression screen [facility] 2/9 10/18/2018  Decreased Interest 0  Down, Depressed, Hopeless 0  PHQ - 2 Score 0  Altered sleeping 0  Tired, decreased energy 0  Change in appetite 0  Feeling bad or failure about yourself  0  Trouble concentrating 0  Moving slowly or fidgety/restless 0  Suicidal thoughts 0  PHQ-9 Score 0  Difficult doing work/chores Not difficult at all    The patient does not have a history of falls. I did not complete a risk assessment for falls. A plan of care for falls was not documented.   Past Medical History:  Past Medical History:  Diagnosis Date  . Hodgkin's lymphoma ([place])   . Kidney stones     Surgical History:  Past Surgical History:  Procedure Laterality Date  . BONE MARROW BIOPSY     hodgkins lymphoma  . VASECTOMY      Medications:  No current outpatient medications on file prior to visit.   No current facility-administered medications on file prior to visit.     Allergies:  No Known Allergies  Social History:  Social History   Socioeconomic History  . Marital status: Single    Spouse name: Not on file  . Number of children: Not on file  . Years of education: Not on file  .  Highest education level: Not on file  Occupational History  . Not on file  Social Needs  . Financial resource strain: Not on file  . Food insecurity:    Worry: Not on file    Inability: Not on file  . Transportation needs:    Medical: Not on file    Non-medical: Not on file  Tobacco Use  . Smoking status: Never Smoker  . Smokeless tobacco: Never Used  Substance and Sexual Activity  . Alcohol use: Yes    Comment: 3 nights per week  . Drug use: No  . Sexual activity: Yes  Lifestyle  . Physical activity:    Days per week: Not on file    Minutes per session: Not on file  . Stress: Not on file  Relationships  . Social connections:    Talks on phone: Not on file    Gets together: Not on file    Attends religious service: Not on file    Active member of club or organization: Not on file    Attends meetings of clubs or organizations: Not on file    Relationship status: Not on file  . Intimate partner violence:    Fear of current or ex partner:  Not on file    Emotionally abused: Not on file    Physically abused: Not on file    Forced sexual activity: Not on file  Other Topics Concern  . Not on file  Social History Narrative  . Not on file   Social History   Tobacco Use  Smoking Status Never Smoker  Smokeless Tobacco Never Used   Social History   Substance and Sexual Activity  Alcohol Use Yes   Comment: 3 nights per week    Family History:  Family History  Problem Relation Age of Onset  . Cancer Mother        lung  . Kidney Stones Maternal Grandfather   . Kidney cancer Neg Hx   . Bladder Cancer Neg Hx   . Prostate cancer Neg Hx     Past medical history, surgical history, medications, allergies, family history and social history reviewed with patient today and changes made to appropriate areas of the chart.   Review of Systems - General ROS: negative Psychological ROS: negative Ophthalmic ROS: negative ENT ROS: negative Allergy and Immunology ROS:  negative Hematological and Lymphatic ROS: negative Endocrine ROS: negative Breast ROS: negative for breast lumps Respiratory ROS: no cough, shortness of breath, or wheezing Cardiovascular ROS: no chest pain or dyspnea on exertion Gastrointestinal ROS: no abdominal pain, change in bowel habits, or black or bloody stools Genito-Urinary ROS: no dysuria, trouble voiding, or hematuria Musculoskeletal ROS: negative Neurological ROS: no TIA or stroke symptoms Dermatological ROS: negative All other ROS negative except what is listed above and in the HPI.      Objective:    BP (!) 150/104   Pulse 81   Temp 97.8 F (36.6 C) (Oral)   Ht _0  (1.753 m)   Wt 172 lb 8 oz (78.2 kg)   SpO2 97%   BMI 25.47 kg/m   Wt Readings from Last 3 Encounters:  10/18/18 172 lb 8 oz (78.2 kg)  03/14/17 160 lb 3.2 oz (72.7 kg)  02/19/17 161 lb 6.4 oz (73.2 kg)    Physical Exam Vitals signs and nursing note reviewed.  Constitutional:      General: He is not in acute distress.    Appearance: He is well-developed.  HENT:     Head: Atraumatic.     Right Ear: External ear normal.     Left Ear: External ear normal.     Nose: Nose normal.  Eyes:     General: No scleral icterus.    Conjunctiva/sclera: Conjunctivae normal.     Pupils: Pupils are equal, round, and reactive to light.  Neck:     Musculoskeletal: Normal range of motion and neck supple.  Cardiovascular:     Rate and Rhythm: Normal rate and regular rhythm.     Heart sounds: Normal heart sounds. No murmur.  Pulmonary:     Effort: Pulmonary effort is normal. No respiratory distress.     Breath sounds: Normal breath sounds.  Abdominal:     General: Bowel sounds are normal. There is no distension.     Palpations: Abdomen is soft. There is no mass.     Tenderness: There is no abdominal tenderness. There is no guarding.  Genitourinary:    Comments: Exam declined Musculoskeletal: Normal range of motion.        General: No tenderness.   Skin:    General: Skin is warm and dry.     Findings: No rash.  Neurological:     General: No focal  deficit present.     Mental Status: He is alert and oriented to person, place, and time.     Deep Tendon Reflexes: Reflexes are normal and symmetric.  Psychiatric:        Mood and Affect: Mood normal.        Behavior: Behavior normal.        Thought Content: Thought content normal.     Results for orders placed or performed in visit on 10/18/18  CBC with Differential/Platelet  Result Value Ref Range   WBC 6.4 3.4 - 10.8 x10E3/uL   RBC 4.46 4.14 - 5.80 x10E6/uL   Hemoglobin 15.0 13.0 - 17.7 g/dL   Hematocrit 41.9 37.5 - 51.0 %   MCV 94 79 - 97 fL   MCH 33.6 (H) 26.6 - 33.0 pg   MCHC 35.8 (H) 31.5 - 35.7 g/dL   RDW 12.3 11.6 - 15.4 %   Platelets 331 150 - 450 x10E3/uL   Neutrophils 69 Not Estab. %   Lymphs 22 Not Estab. %   Monocytes 7 Not Estab. %   Eos 1 Not Estab. %   Basos 1 Not Estab. %   Neutrophils Absolute 4.4 1.4 - 7.0 x10E3/uL   Lymphocytes Absolute 1.4 0.7 - 3.1 x10E3/uL   Monocytes Absolute 0.4 0.1 - 0.9 x10E3/uL   EOS (ABSOLUTE) 0.1 0.0 - 0.4 x10E3/uL   Basophils Absolute 0.0 0.0 - 0.2 x10E3/uL   Immature Granulocytes 0 Not Estab. %   Immature Grans (Abs) 0.0 0.0 - 0.1 x10E3/uL  Comprehensive metabolic panel  Result Value Ref Range   Glucose 111 (H) 65 - 99 mg/dL   BUN 22 6 - 24 mg/dL   Creatinine, Ser 1.42 (H) 0.76 - 1.27 mg/dL   GFR calc non Af Amer 56 (L) >59 mL/min/1.73   GFR calc Af Amer 65 >59 mL/min/1.73   BUN/Creatinine Ratio 15 9 - 20   Sodium 142 134 - 144 mmol/L   Potassium 4.9 3.5 - 5.2 mmol/L   Chloride 102 96 - 106 mmol/L   CO2 18 (L) 20 - 29 mmol/L   Calcium 10.4 (H) 8.7 - 10.2 mg/dL   Total Protein 8.0 6.0 - 8.5 g/dL   Albumin 4.8 3.8 - 4.9 g/dL   Globulin, Total 3.2 1.5 - 4.5 g/dL   Albumin/Globulin Ratio 1.5 1.2 - 2.2   Bilirubin Total 0.5 0.0 - 1.2 mg/dL   Alkaline Phosphatase 90 39 - 117 IU/L   AST 44 (H) 0 - 40 IU/L   ALT 50  (H) 0 - 44 IU/L  Lipid Panel w/o Chol/HDL Ratio  Result Value Ref Range   Cholesterol, Total 211 (H) 100 - 199 mg/dL   Triglycerides 122 0 - 149 mg/dL   HDL 80 >39 mg/dL   VLDL Cholesterol Cal 24 5 - 40 mg/dL   LDL Calculated 107 (H) 0 - 99 mg/dL  TSH  Result Value Ref Range   TSH 1.060 0.450 - 4.500 uIU/mL  UA/M w/rflx Culture, Routine  Result Value Ref Range   Specific Gravity, UA 1.025 1.005 - 1.030   pH, UA 5.5 5.0 - 7.5   Color, UA Yellow Yellow   Appearance Ur Clear Clear   Leukocytes, UA Negative Negative   Protein, UA Negative Negative/Trace   Glucose, UA Negative Negative   Ketones, UA Negative Negative   RBC, UA Negative Negative   Bilirubin, UA Negative Negative   Urobilinogen, Ur 0.2 0.2 - 1.0 mg/dL   Nitrite, UA Negative Negative  Microscopic Examination WILL FOLLOW    Microscopic Examination WILL FOLLOW    Urinalysis Reflex WILL FOLLOW   HIV Antibody (routine testing w rflx)  Result Value Ref Range   HIV Screen 4th Generation wRfx Non Reactive Non Reactive  RPR  Result Value Ref Range   RPR Ser Ql Non Reactive Non Reactive  HSV(herpes simplex vrs) 1+2 ab-IgG  Result Value Ref Range   HSV 1 Glycoprotein G Ab, IgG 13.00 (H) 0.00 - 0.90 index   HSV 2 IgG, Type Spec 13.30 (H) 0.00 - 0.90 index      Assessment & Plan:   Problem List Items Addressed This Visit      Other   Elevated BP without diagnosis of hypertension - Primary    DASH diet, exercise,stress reduction. Recheck in 1 week       Other Visit Diagnoses    Annual physical exam       Relevant Orders   CBC with Differential/Platelet (Completed)   Comprehensive metabolic panel (Completed)   Lipid Panel w/o Chol/HDL Ratio (Completed)   TSH (Completed)   UA/M w/rflx Culture, Routine (Completed)   HIV Antibody (routine testing w rflx) (Completed)   Screening for colon cancer       Relevant Orders   Ambulatory referral to Gastroenterology   Routine screening for STI (sexually transmitted  infection)       Relevant Orders   RPR (Completed)   HSV(herpes simplex vrs) 1+2 ab-IgG (Completed)   GC/Chlamydia Probe Amp   Need for Td vaccine       Relevant Orders   Td vaccine greater than or equal to 7yo preservative free IM (Completed)   Encounter to establish care           Discussed aspirin prophylaxis for myocardial infarction prevention and decision was it was not indicated  LABORATORY TESTING:  Health maintenance labs ordered today as discussed above.   The natural history of prostate cancer and ongoing controversy regarding screening and potential treatment outcomes of prostate cancer has been discussed with the patient. The meaning of a false positive PSA and a false negative PSA has been discussed. He indicates understanding of the limitations of this screening test and wishes not to proceed with screening PSA testing.   IMMUNIZATIONS:   - Tdap: Tetanus vaccination status reviewed: Td vaccination indicated and given today. - Influenza: Refused  SCREENING: - Colonoscopy: Ordered today  Discussed with patient purpose of the colonoscopy is to detect colon cancer at curable precancerous or early stages   PATIENT COUNSELING:    Sexuality: Discussed sexually transmitted diseases, partner selection, use of condoms, avoidance of unintended pregnancy  and contraceptive alternatives.   Advised to avoid cigarette smoking.  I discussed with the patient that most people either abstain from alcohol or drink within safe limits (<=14/week and <=4 drinks/occasion for males, <=7/weeks and <= 3 drinks/occasion for females) and that the risk for alcohol disorders and other health effects rises proportionally with the number of drinks per week and how often a drinker exceeds daily limits.  Discussed cessation/primary prevention of drug use and availability of treatment for abuse.   Diet: Encouraged to adjust caloric intake to maintain  or achieve ideal body weight, to reduce intake  of dietary saturated fat and total fat, to limit sodium intake by avoiding high sodium foods and not adding table salt, and to maintain adequate dietary potassium and calcium preferably from fresh fruits, vegetables, and low-fat dairy products.    stressed the importance  of regular exercise  Injury prevention: Discussed safety belts, safety helmets, smoke detector, smoking near bedding or upholstery.   Dental health: Discussed importance of regular tooth brushing, flossing, and dental visits.   Follow up plan: NEXT PREVENTATIVE PHYSICAL DUE IN 1 YEAR. Return in about 1 week (around 10/25/2018) for BP.

## 2018-10-21 LAB — UA/M W/RFLX CULTURE, ROUTINE
Bilirubin, UA: NEGATIVE
Glucose, UA: NEGATIVE
Ketones, UA: NEGATIVE
LEUKOCYTES UA: NEGATIVE
Nitrite, UA: NEGATIVE
PH UA: 5.5 (ref 5.0–7.5)
PROTEIN UA: NEGATIVE
RBC, UA: NEGATIVE
SPEC GRAV UA: 1.025 (ref 1.005–1.030)
Urobilinogen, Ur: 0.2 mg/dL (ref 0.2–1.0)

## 2018-10-21 LAB — GC/CHLAMYDIA PROBE AMP
Chlamydia trachomatis, NAA: NEGATIVE
Neisseria gonorrhoeae by PCR: NEGATIVE

## 2018-10-25 ENCOUNTER — Ambulatory Visit (INDEPENDENT_AMBULATORY_CARE_PROVIDER_SITE_OTHER): Payer: Federal, State, Local not specified - PPO | Admitting: Family Medicine

## 2018-10-25 ENCOUNTER — Encounter: Payer: Self-pay | Admitting: Family Medicine

## 2018-10-25 VITALS — BP 142/96 | HR 65 | Temp 97.5°F | Wt 170.5 lb

## 2018-10-25 DIAGNOSIS — I10 Essential (primary) hypertension: Secondary | ICD-10-CM | POA: Diagnosis not present

## 2018-10-25 MED ORDER — LISINOPRIL 5 MG PO TABS: 5.0000 mg | ORAL_TABLET | Freq: Every day | ORAL | 0 refills | Status: DC

## 2018-10-25 NOTE — Progress Notes (Signed)
BP (!) 142/96   Pulse 65   Temp (!) 97.5 F (36.4 C) (Oral)   Wt 170 lb 8 oz (77.3 kg)   SpO2 98%   BMI 25.18 kg/m    Subjective:    Patient ID: James Shaffer, male    DOB: April 09, 1966, 53 y.o.   MRN: [id number]  HPI: James Shaffer is a 53 y.o. male  Chief Complaint  Patient presents with  . Hypertension    1 week f/up   Here today for 1 week BP f/u. BP was significantly elevated at establish care visit last week, was counseled on DASH diet, avoiding alcohol, stress reduction, exercise. Pt heeded advice and is here for recheck. Has never had a BP issue in the past that he's aware of. Denies CP, SOB, HAs, dizziness.   Relevant past medical, surgical, family and social history reviewed and updated as indicated. Interim medical history since our last visit reviewed. Allergies and medications reviewed and updated.  Review of Systems  Per HPI unless specifically indicated above     Objective:    BP (!) 142/96   Pulse 65   Temp (!) 97.5 F (36.4 C) (Oral)   Wt 170 lb 8 oz (77.3 kg)   SpO2 98%   BMI 25.18 kg/m   Wt Readings from Last 3 Encounters:  10/25/18 170 lb 8 oz (77.3 kg)  10/18/18 172 lb 8 oz (78.2 kg)  03/14/17 160 lb 3.2 oz (72.7 kg)    Physical Exam Vitals signs and nursing note reviewed.  Constitutional:      Appearance: Normal appearance.  HENT:     Head: Atraumatic.  Eyes:     Extraocular Movements: Extraocular movements intact.     Conjunctiva/sclera: Conjunctivae normal.  Neck:     Musculoskeletal: Normal range of motion and neck supple.  Cardiovascular:     Rate and Rhythm: Normal rate and regular rhythm.  Pulmonary:     Effort: Pulmonary effort is normal.     Breath sounds: Normal breath sounds.  Musculoskeletal: Normal range of motion.  Skin:    General: Skin is warm and dry.  Neurological:     General: No focal deficit present.     Mental Status: He is oriented to person, place, and time.  Psychiatric:        Mood and Affect: Mood  normal.        Thought Content: Thought content normal.        Judgment: Judgment normal.     Results for orders placed or performed in visit on 10/18/18  GC/Chlamydia Probe Amp  Result Value Ref Range   Chlamydia trachomatis, NAA Negative Negative   Neisseria gonorrhoeae by PCR Negative Negative  CBC with Differential/Platelet  Result Value Ref Range   WBC 6.4 3.4 - 10.8 x10E3/uL   RBC 4.46 4.14 - 5.80 x10E6/uL   Hemoglobin 15.0 13.0 - 17.7 g/dL   Hematocrit 41.9 37.5 - 51.0 %   MCV 94 79 - 97 fL   MCH 33.6 (H) 26.6 - 33.0 pg   MCHC 35.8 (H) 31.5 - 35.7 g/dL   RDW 12.3 11.6 - 15.4 %   Platelets 331 150 - 450 x10E3/uL   Neutrophils 69 Not Estab. %   Lymphs 22 Not Estab. %   Monocytes 7 Not Estab. %   Eos 1 Not Estab. %   Basos 1 Not Estab. %   Neutrophils Absolute 4.4 1.4 - 7.0 x10E3/uL   Lymphocytes Absolute 1.4 0.7 -  3.1 x10E3/uL   Monocytes Absolute 0.4 0.1 - 0.9 x10E3/uL   EOS (ABSOLUTE) 0.1 0.0 - 0.4 x10E3/uL   Basophils Absolute 0.0 0.0 - 0.2 x10E3/uL   Immature Granulocytes 0 Not Estab. %   Immature Grans (Abs) 0.0 0.0 - 0.1 x10E3/uL  Comprehensive metabolic panel  Result Value Ref Range   Glucose 111 (H) 65 - 99 mg/dL   BUN 22 6 - 24 mg/dL   Creatinine, Ser 1.42 (H) 0.76 - 1.27 mg/dL   GFR calc non Af Amer 56 (L) >59 mL/min/1.73   GFR calc Af Amer 65 >59 mL/min/1.73   BUN/Creatinine Ratio 15 9 - 20   Sodium 142 134 - 144 mmol/L   Potassium 4.9 3.5 - 5.2 mmol/L   Chloride 102 96 - 106 mmol/L   CO2 18 (L) 20 - 29 mmol/L   Calcium 10.4 (H) 8.7 - 10.2 mg/dL   Total Protein 8.0 6.0 - 8.5 g/dL   Albumin 4.8 3.8 - 4.9 g/dL   Globulin, Total 3.2 1.5 - 4.5 g/dL   Albumin/Globulin Ratio 1.5 1.2 - 2.2   Bilirubin Total 0.5 0.0 - 1.2 mg/dL   Alkaline Phosphatase 90 39 - 117 IU/L   AST 44 (H) 0 - 40 IU/L   ALT 50 (H) 0 - 44 IU/L  Lipid Panel w/o Chol/HDL Ratio  Result Value Ref Range   Cholesterol, Total 211 (H) 100 - 199 mg/dL   Triglycerides 122 0 - 149 mg/dL    HDL 80 >39 mg/dL   VLDL Cholesterol Cal 24 5 - 40 mg/dL   LDL Calculated 107 (H) 0 - 99 mg/dL  TSH  Result Value Ref Range   TSH 1.060 0.450 - 4.500 uIU/mL  UA/M w/rflx Culture, Routine  Result Value Ref Range   Specific Gravity, UA 1.025 1.005 - 1.030   pH, UA 5.5 5.0 - 7.5   Color, UA Yellow Yellow   Appearance Ur Clear Clear   Leukocytes, UA Negative Negative   Protein, UA Negative Negative/Trace   Glucose, UA Negative Negative   Ketones, UA Negative Negative   RBC, UA Negative Negative   Bilirubin, UA Negative Negative   Urobilinogen, Ur 0.2 0.2 - 1.0 mg/dL   Nitrite, UA Negative Negative  HIV Antibody (routine testing w rflx)  Result Value Ref Range   HIV Screen 4th Generation wRfx Non Reactive Non Reactive  RPR  Result Value Ref Range   RPR Ser Ql Non Reactive Non Reactive  HSV(herpes simplex vrs) 1+2 ab-IgG  Result Value Ref Range   HSV 1 Glycoprotein G Ab, IgG 13.00 (H) 0.00 - 0.90 index   HSV 2 IgG, Type Spec 13.30 (H) 0.00 - 0.90 index      Assessment & Plan:   Problem List Items Addressed This Visit      Cardiovascular and Mediastinum   Essential hypertension - Primary    BP still elevated today despite trying lifestyle modifications. Will add low dose lisinopril and monitor home readings. F/u in 1 month for recheck      Relevant Medications   lisinopril (PRINIVIL,ZESTRIL) 5 MG tablet       Follow up plan: Return in about 4 weeks (around 11/22/2018) for BP.

## 2018-10-28 NOTE — Assessment & Plan Note (Signed)
BP still elevated today despite trying lifestyle modifications. Will add low dose lisinopril and monitor home readings. F/u in 1 month for recheck

## 2018-10-29 ENCOUNTER — Encounter: Payer: Self-pay | Admitting: *Deleted

## 2018-11-18 ENCOUNTER — Other Ambulatory Visit: Payer: Self-pay | Admitting: Family Medicine

## 2018-11-19 ENCOUNTER — Telehealth: Payer: Self-pay | Admitting: Family Medicine

## 2018-11-19 NOTE — Telephone Encounter (Signed)
Copied from [place] [phone]. Topic: Quick Communication - Rx Refill/Question >> Nov 19, 2018  3:57 PM Mcneil, Ja-Kwan wrote: Medication: lisinopril (PRINIVIL,ZESTRIL) 5 MG tablet  Has the patient contacted their pharmacy? no  Preferred Pharmacy (with phone number or street name): [id number] [address], [place] [phone] (Phone)  [phone] (Fax)  Agent: Please be advised that RX refills may take up to 3 business days. We ask that you follow-up with your pharmacy.

## 2018-11-20 NOTE — Telephone Encounter (Signed)
LVM for pt to call back.

## 2018-11-20 NOTE — Telephone Encounter (Signed)
Due for f/u this week for his BP - please see if he has access to a BP cuff and we can do this via video visit.

## 2018-11-22 ENCOUNTER — Ambulatory Visit: Payer: Federal, State, Local not specified - PPO | Admitting: Family Medicine

## 2018-11-22 NOTE — Telephone Encounter (Signed)
Please try and schedule him once more, if he doesn't answer OK to send letter

## 2018-11-22 NOTE — Telephone Encounter (Signed)
Spoke with pt and he stated that he didn't have a BP cuff but he would try to get one and would call back next week.

## 2018-11-25 ENCOUNTER — Encounter: Payer: Self-pay | Admitting: Family Medicine

## 2018-11-25 ENCOUNTER — Ambulatory Visit (INDEPENDENT_AMBULATORY_CARE_PROVIDER_SITE_OTHER): Payer: Federal, State, Local not specified - PPO | Admitting: Family Medicine

## 2018-11-25 VITALS — BP 158/92 | HR 89

## 2018-11-25 DIAGNOSIS — I10 Essential (primary) hypertension: Secondary | ICD-10-CM

## 2018-11-25 MED ORDER — LISINOPRIL 10 MG PO TABS
10.0000 mg | ORAL_TABLET | Freq: Every day | ORAL | 0 refills | Status: DC
Start: 2018-11-25 — End: 2018-12-21

## 2018-11-25 NOTE — Progress Notes (Signed)
BP (!) 158/92   Pulse 89    Subjective:    Patient ID: James Shaffer, male    DOB: 05-14-1966, 53 y.o.   MRN: [id number]  HPI: James Shaffer is a 53 y.o. male  Chief Complaint  Patient presents with  . Hypertension   Here today for 1 month BP f/u. Started on 5 mg lisinopril at previous visit. Tolerating well, no side effects noted. Home BPs running 150s/90s when he checks. DASH diet followed, runs about 3 times weekly. Denies CP, SOB, HAs, dizziness. Of note, pt does take 1/2 tab caffeine pill TID due to headaches. Does have a fhx of HTN.   Relevant past medical, surgical, family and social history reviewed and updated as indicated. Interim medical history since our last visit reviewed. Allergies and medications reviewed and updated.  Review of Systems  Per HPI unless specifically indicated above     Objective:    BP (!) 158/92   Pulse 89   Wt Readings from Last 3 Encounters:  10/25/18 170 lb 8 oz (77.3 kg)  10/18/18 172 lb 8 oz (78.2 kg)  03/14/17 160 lb 3.2 oz (72.7 kg)    Physical Exam Vitals signs and nursing note reviewed.  Constitutional:      General: He is not in acute distress.    Appearance: Normal appearance.  HENT:     Head: Atraumatic.     Right Ear: External ear normal.     Left Ear: External ear normal.     Nose: Nose normal.     Mouth/Throat:     Mouth: Mucous membranes are moist.     Pharynx: Oropharynx is clear.  Eyes:     Extraocular Movements: Extraocular movements intact.     Conjunctiva/sclera: Conjunctivae normal.  Neck:     Musculoskeletal: Normal range of motion.  Cardiovascular:     Rate and Rhythm: Normal rate and regular rhythm.  Pulmonary:     Effort: Pulmonary effort is normal. No respiratory distress.  Musculoskeletal: Normal range of motion.  Skin:    General: Skin is dry.     Findings: No erythema or rash.  Neurological:     Mental Status: He is oriented to person, place, and time.  Psychiatric:        Mood and Affect:  Mood normal.        Thought Content: Thought content normal.        Judgment: Judgment normal.     Results for orders placed or performed in visit on 10/18/18  GC/Chlamydia Probe Amp  Result Value Ref Range   Chlamydia trachomatis, NAA Negative Negative   Neisseria gonorrhoeae by PCR Negative Negative  CBC with Differential/Platelet  Result Value Ref Range   WBC 6.4 3.4 - 10.8 x10E3/uL   RBC 4.46 4.14 - 5.80 x10E6/uL   Hemoglobin 15.0 13.0 - 17.7 g/dL   Hematocrit 41.9 37.5 - 51.0 %   MCV 94 79 - 97 fL   MCH 33.6 (H) 26.6 - 33.0 pg   MCHC 35.8 (H) 31.5 - 35.7 g/dL   RDW 12.3 11.6 - 15.4 %   Platelets 331 150 - 450 x10E3/uL   Neutrophils 69 Not Estab. %   Lymphs 22 Not Estab. %   Monocytes 7 Not Estab. %   Eos 1 Not Estab. %   Basos 1 Not Estab. %   Neutrophils Absolute 4.4 1.4 - 7.0 x10E3/uL   Lymphocytes Absolute 1.4 0.7 - 3.1 x10E3/uL   Monocytes Absolute 0.4  0.1 - 0.9 x10E3/uL   EOS (ABSOLUTE) 0.1 0.0 - 0.4 x10E3/uL   Basophils Absolute 0.0 0.0 - 0.2 x10E3/uL   Immature Granulocytes 0 Not Estab. %   Immature Grans (Abs) 0.0 0.0 - 0.1 x10E3/uL  Comprehensive metabolic panel  Result Value Ref Range   Glucose 111 (H) 65 - 99 mg/dL   BUN 22 6 - 24 mg/dL   Creatinine, Ser 1.42 (H) 0.76 - 1.27 mg/dL   GFR calc non Af Amer 56 (L) >59 mL/min/1.73   GFR calc Af Amer 65 >59 mL/min/1.73   BUN/Creatinine Ratio 15 9 - 20   Sodium 142 134 - 144 mmol/L   Potassium 4.9 3.5 - 5.2 mmol/L   Chloride 102 96 - 106 mmol/L   CO2 18 (L) 20 - 29 mmol/L   Calcium 10.4 (H) 8.7 - 10.2 mg/dL   Total Protein 8.0 6.0 - 8.5 g/dL   Albumin 4.8 3.8 - 4.9 g/dL   Globulin, Total 3.2 1.5 - 4.5 g/dL   Albumin/Globulin Ratio 1.5 1.2 - 2.2   Bilirubin Total 0.5 0.0 - 1.2 mg/dL   Alkaline Phosphatase 90 39 - 117 IU/L   AST 44 (H) 0 - 40 IU/L   ALT 50 (H) 0 - 44 IU/L  Lipid Panel w/o Chol/HDL Ratio  Result Value Ref Range   Cholesterol, Total 211 (H) 100 - 199 mg/dL   Triglycerides 122 0 - 149  mg/dL   HDL 80 >39 mg/dL   VLDL Cholesterol Cal 24 5 - 40 mg/dL   LDL Calculated 107 (H) 0 - 99 mg/dL  TSH  Result Value Ref Range   TSH 1.060 0.450 - 4.500 uIU/mL  UA/M w/rflx Culture, Routine  Result Value Ref Range   Specific Gravity, UA 1.025 1.005 - 1.030   pH, UA 5.5 5.0 - 7.5   Color, UA Yellow Yellow   Appearance Ur Clear Clear   Leukocytes, UA Negative Negative   Protein, UA Negative Negative/Trace   Glucose, UA Negative Negative   Ketones, UA Negative Negative   RBC, UA Negative Negative   Bilirubin, UA Negative Negative   Urobilinogen, Ur 0.2 0.2 - 1.0 mg/dL   Nitrite, UA Negative Negative  HIV Antibody (routine testing w rflx)  Result Value Ref Range   HIV Screen 4th Generation wRfx Non Reactive Non Reactive  RPR  Result Value Ref Range   RPR Ser Ql Non Reactive Non Reactive  HSV(herpes simplex vrs) 1+2 ab-IgG  Result Value Ref Range   HSV 1 Glycoprotein G Ab, IgG 13.00 (H) 0.00 - 0.90 index   HSV 2 IgG, Type Spec 13.30 (H) 0.00 - 0.90 index      Assessment & Plan:   Problem List Items Addressed This Visit      Cardiovascular and Mediastinum   Essential hypertension - Primary    BPs not to goal, increase to 10 mg lisinopril and f/u in 2 weeks. Continue home readings as able, and work on tapering back on caffeine supplements      Relevant Medications   lisinopril (PRINIVIL,ZESTRIL) 10 MG tablet       Follow up plan: Return in about 2 weeks (around 12/09/2018) for HTN.

## 2018-11-25 NOTE — Assessment & Plan Note (Signed)
BPs not to goal, increase to 10 mg lisinopril and f/u in 2 weeks. Continue home readings as able, and work on tapering back on caffeine supplements

## 2018-12-21 ENCOUNTER — Other Ambulatory Visit: Payer: Self-pay | Admitting: Family Medicine

## 2019-01-09 ENCOUNTER — Other Ambulatory Visit: Payer: Self-pay | Admitting: Family Medicine

## 2019-01-30 ENCOUNTER — Other Ambulatory Visit: Payer: Self-pay | Admitting: Family Medicine

## 2019-02-28 ENCOUNTER — Other Ambulatory Visit: Payer: Self-pay | Admitting: Family Medicine

## 2019-02-28 NOTE — Telephone Encounter (Signed)
Requested medication (s) are due for refill today: yes  Requested medication (s) are on the active medication list: yes  Last refill:  01/31/2019  Future visit scheduled: no  Notes to clinic:  No valid encounter in last 6 months    Requested Prescriptions  Pending Prescriptions Disp Refills   lisinopril (ZESTRIL) 10 MG tablet [Pharmacy Med Name: LISINOPRIL 10 MG TABLET] 30 tablet 0    Sig: TAKE 1 TABLET BY MOUTH EVERY DAY     Cardiovascular:  ACE Inhibitors Failed - 02/28/2019 11:32 AM      Failed - Cr in normal range and within 180 days    Creatinine, Ser  Date Value Ref Range Status  10/18/2018 1.42 (H) 0.76 - 1.27 mg/dL Final         Failed - Last BP in normal range    BP Readings from Last 1 Encounters:  11/25/18 (!) 158/92         Passed - K in normal range and within 180 days    Potassium  Date Value Ref Range Status  10/18/2018 4.9 3.5 - 5.2 mmol/L Final         Passed - Patient is not pregnant      Passed - Valid encounter within last 6 months    Recent Outpatient Visits          3 months ago Essential hypertension   [facility] [place], [place]   4 months ago Essential hypertension   [place], [place], [place]   4 months ago Elevated BP without diagnosis of hypertension   [place], [place], [place]

## 2019-03-06 ENCOUNTER — Ambulatory Visit: Payer: Self-pay | Admitting: *Deleted

## 2019-03-06 ENCOUNTER — Ambulatory Visit (INDEPENDENT_AMBULATORY_CARE_PROVIDER_SITE_OTHER): Payer: Federal, State, Local not specified - PPO | Admitting: Family Medicine

## 2019-03-06 ENCOUNTER — Encounter: Payer: Self-pay | Admitting: Family Medicine

## 2019-03-06 ENCOUNTER — Other Ambulatory Visit: Payer: Self-pay

## 2019-03-06 VITALS — BP 141/101 | Ht 69.0 in | Wt 167.0 lb

## 2019-03-06 DIAGNOSIS — R109 Unspecified abdominal pain: Secondary | ICD-10-CM | POA: Diagnosis not present

## 2019-03-06 DIAGNOSIS — I10 Essential (primary) hypertension: Secondary | ICD-10-CM

## 2019-03-06 DIAGNOSIS — R197 Diarrhea, unspecified: Secondary | ICD-10-CM

## 2019-03-06 NOTE — Telephone Encounter (Signed)
Patient has been out of work for 2 days now- he has had diarrhea and stomach ache, minor headache, leg pain, slight cough- ( which patient attributes to medication for BP)  Reason for Disposition . [1] Constant abdominal pain AND [2] present > 2 hours  Answer Assessment - Initial Assessment Questions 1. DIARRHEA SEVERITY: "How bad is the diarrhea?" "How many extra stools have you had in the past 24 hours than normal?"    - NO DIARRHEA (SCALE 0)   - MILD (SCALE 1-3): Few loose or mushy BMs; increase of 1-3 stools over normal daily number of stools; mild increase in ostomy output.   -  MODERATE (SCALE 4-7): Increase of 4-6 stools daily over normal; moderate increase in ostomy output. * SEVERE (SCALE 8-10; OR 'WORST POSSIBLE'): Increase of 7 or more stools daily over normal; moderate increase in ostomy output; incontinence.     moderate 2. ONSET: "When did the diarrhea begin?"      Monday evening 3. BM CONSISTENCY: "How loose or watery is the diarrhea?"      Loose to watery 4. VOMITING: "Are you also vomiting?" If so, ask: "How many times in the past 24 hours?"      no 5. ABDOMINAL PAIN: "Are you having any abdominal pain?" If yes: "What does it feel like?" (e.g., crampy, dull, intermittent, constant)      Yes- dull pain that started as upper pain- now lower pan 6. ABDOMINAL PAIN SEVERITY: If present, ask: "How bad is the pain?"  (e.g., Scale 1-10; mild, moderate, or severe)   - MILD (1-3): doesn't interfere with normal activities, abdomen soft and not tender to touch    - MODERATE (4-7): interferes with normal activities or awakens from sleep, tender to touch    - SEVERE (8-10): excruciating pain, doubled over, unable to do any normal activities       3- laying or sitting- not as bad- walking or bending it is worse- can be 7 7. ORAL INTAKE: If vomiting, "Have you been able to drink liquids?" "How much fluids have you had in the past 24 hours?"     Yes- at least 5 glasses 18 oz /day 8.  HYDRATION: "Any signs of dehydration?" (e.g., dry mouth [not just dry lips], too weak to stand, dizziness, new weight loss) "When did you last urinate?"     Dizziness Tuesday at work- nothing since then, 2 hours ago- still 9. 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 ANTIBIOTIC USE: "Are you taking antibiotics now or have you taken antibiotics in the past 2 months?"       no 11. OTHER SYMPTOMS: "Do you have any other symptoms?" (e.g., fever, blood in stool)       no 12. PREGNANCY: "Is there any chance you are pregnant?" "When was your last menstrual period?"       n/a  Protocols used: DIARRHEA-A-AH

## 2019-03-06 NOTE — Progress Notes (Signed)
BP (!) 141/101 (BP Location: Left Arm, Patient Position: Sitting, Cuff Size: Normal)   Ht 5\' 9"  (1.753 m)   Wt 167 lb (75.8 kg)   BMI 24.66 kg/m    Subjective:    Patient ID: James Shaffer, male    DOB: 06-Oct-1965, 53 y.o.   MRN: [id number]  HPI: James Shaffer is a 53 y.o. male  Chief Complaint  Patient presents with  . Diarrhea    Ongoing 3 days.   . Abdominal Pain  . Cough  . Generalized Body Aches  . Fatigue    . This visit was completed via WebEx due to the restrictions of the COVID-19 pandemic. All issues as above were discussed and addressed. Physical exam was done as above through visual confirmation on WebEx. If it was felt that the patient should be evaluated in the office, they were directed there. The patient verbally consented to this visit. . Location of the patient: home . Location of the provider: work . Those involved with this call:  . Provider: Merrie Roof, PA-C . CMA: Lesle Chris, [place] . Front Desk/Registration: Jill Side  . Time spent on call: 15 minutes with patient face to face via video conference. More than 50% of this time was spent in counseling and coordination of care. 5 minutes total spent in review of patient's record and preparation of their chart. I verified patient identity using two factors (patient name and date of birth). Patient consents verbally to being seen via telemedicine visit today.   Abdominal pain, anorexia, diarrhea the past 2 days. Had a bit of dizziness as well intermittently. Pepto bismol seemed to helping, and today feeling much better overall. Occasional cough but wondering if that may be from his BP medication or something else entirely.   Has not been checking BPs often the past few months, yesterday was 120/70s and today was elevated in the 140s/100s. Denies side effects to the medicine. Not taking as consistently as he should.   Relevant past medical, surgical, family and social history reviewed and updated as  indicated. Interim medical history since our last visit reviewed. Allergies and medications reviewed and updated.  Review of Systems  Per HPI unless specifically indicated above     Objective:    BP (!) 141/101 (BP Location: Left Arm, Patient Position: Sitting, Cuff Size: Normal)   Ht 5\' 9"  (1.753 m)   Wt 167 lb (75.8 kg)   BMI 24.66 kg/m   Wt Readings from Last 3 Encounters:  03/06/19 167 lb (75.8 kg)  10/25/18 170 lb 8 oz (77.3 kg)  10/18/18 172 lb 8 oz (78.2 kg)    Physical Exam Vitals signs and nursing note reviewed.  Constitutional:      General: He is not in acute distress.    Appearance: Normal appearance.  HENT:     Head: Atraumatic.     Right Ear: External ear normal.     Left Ear: External ear normal.     Nose: Nose normal. No congestion.     Mouth/Throat:     Mouth: Mucous membranes are moist.     Pharynx: Oropharynx is clear.  Eyes:     Extraocular Movements: Extraocular movements intact.     Conjunctiva/sclera: Conjunctivae normal.  Neck:     Musculoskeletal: Normal range of motion.  Cardiovascular:     Rate and Rhythm: Normal rate and regular rhythm.  Pulmonary:     Effort: Pulmonary effort is normal. No respiratory distress.  Abdominal:  Comments: nontender to his self abdominal exam  Musculoskeletal: Normal range of motion.  Skin:    General: Skin is dry.     Findings: No erythema or rash.  Neurological:     Mental Status: He is oriented to person, place, and time.  Psychiatric:        Mood and Affect: Mood normal.        Thought Content: Thought content normal.        Judgment: Judgment normal.     Results for orders placed or performed in visit on 10/18/18  GC/Chlamydia Probe Amp   Specimen: Urine   UR  Result Value Ref Range   Chlamydia trachomatis, NAA Negative Negative   Neisseria gonorrhoeae by PCR Negative Negative  CBC with Differential/Platelet  Result Value Ref Range   WBC 6.4 3.4 - 10.8 x10E3/uL   RBC 4.46 4.14 - 5.80  x10E6/uL   Hemoglobin 15.0 13.0 - 17.7 g/dL   Hematocrit 41.9 37.5 - 51.0 %   MCV 94 79 - 97 fL   MCH 33.6 (H) 26.6 - 33.0 pg   MCHC 35.8 (H) 31.5 - 35.7 g/dL   RDW 12.3 11.6 - 15.4 %   Platelets 331 150 - 450 x10E3/uL   Neutrophils 69 Not Estab. %   Lymphs 22 Not Estab. %   Monocytes 7 Not Estab. %   Eos 1 Not Estab. %   Basos 1 Not Estab. %   Neutrophils Absolute 4.4 1.4 - 7.0 x10E3/uL   Lymphocytes Absolute 1.4 0.7 - 3.1 x10E3/uL   Monocytes Absolute 0.4 0.1 - 0.9 x10E3/uL   EOS (ABSOLUTE) 0.1 0.0 - 0.4 x10E3/uL   Basophils Absolute 0.0 0.0 - 0.2 x10E3/uL   Immature Granulocytes 0 Not Estab. %   Immature Grans (Abs) 0.0 0.0 - 0.1 x10E3/uL  Comprehensive metabolic panel  Result Value Ref Range   Glucose 111 (H) 65 - 99 mg/dL   BUN 22 6 - 24 mg/dL   Creatinine, Ser 1.42 (H) 0.76 - 1.27 mg/dL   GFR calc non Af Amer 56 (L) >59 mL/min/1.73   GFR calc Af Amer 65 >59 mL/min/1.73   BUN/Creatinine Ratio 15 9 - 20   Sodium 142 134 - 144 mmol/L   Potassium 4.9 3.5 - 5.2 mmol/L   Chloride 102 96 - 106 mmol/L   CO2 18 (L) 20 - 29 mmol/L   Calcium 10.4 (H) 8.7 - 10.2 mg/dL   Total Protein 8.0 6.0 - 8.5 g/dL   Albumin 4.8 3.8 - 4.9 g/dL   Globulin, Total 3.2 1.5 - 4.5 g/dL   Albumin/Globulin Ratio 1.5 1.2 - 2.2   Bilirubin Total 0.5 0.0 - 1.2 mg/dL   Alkaline Phosphatase 90 39 - 117 IU/L   AST 44 (H) 0 - 40 IU/L   ALT 50 (H) 0 - 44 IU/L  Lipid Panel w/o Chol/HDL Ratio  Result Value Ref Range   Cholesterol, Total 211 (H) 100 - 199 mg/dL   Triglycerides 122 0 - 149 mg/dL   HDL 80 >39 mg/dL   VLDL Cholesterol Cal 24 5 - 40 mg/dL   LDL Calculated 107 (H) 0 - 99 mg/dL  TSH  Result Value Ref Range   TSH 1.060 0.450 - 4.500 uIU/mL  UA/M w/rflx Culture, Routine   Specimen: Urine   URINE  Result Value Ref Range   Specific Gravity, UA 1.025 1.005 - 1.030   pH, UA 5.5 5.0 - 7.5   Color, UA Yellow Yellow   Appearance Ur  Clear Clear   Leukocytes, UA Negative Negative   Protein, UA  Negative Negative/Trace   Glucose, UA Negative Negative   Ketones, UA Negative Negative   RBC, UA Negative Negative   Bilirubin, UA Negative Negative   Urobilinogen, Ur 0.2 0.2 - 1.0 mg/dL   Nitrite, UA Negative Negative  HIV Antibody (routine testing w rflx)  Result Value Ref Range   HIV Screen 4th Generation wRfx Non Reactive Non Reactive  RPR  Result Value Ref Range   RPR Ser Ql Non Reactive Non Reactive  HSV(herpes simplex vrs) 1+2 ab-IgG  Result Value Ref Range   HSV 1 Glycoprotein G Ab, IgG 13.00 (H) 0.00 - 0.90 index   HSV 2 IgG, Type Spec 13.30 (H) 0.00 - 0.90 index      Assessment & Plan:   Problem List Items Addressed This Visit      Cardiovascular and Mediastinum   Essential hypertension    Continue current regimen, monitor home readings daily. Will fax order for home machine to medical supply. DASH diet, exercise.        Other Visit Diagnoses    Abdominal pain, unspecified abdominal location    -  Primary   Resolving with pepto and rest, but will test for COVID 19 and quarantine in meantime given sxs. Return precautions and supportive care reviewed   Diarrhea, unspecified type           Follow up plan: Return in about 2 weeks (around 03/20/2019) for BP f/u.

## 2019-03-07 ENCOUNTER — Telehealth: Payer: Self-pay

## 2019-03-07 ENCOUNTER — Telehealth: Payer: Self-pay | Admitting: *Deleted

## 2019-03-07 ENCOUNTER — Other Ambulatory Visit: Payer: Federal, State, Local not specified - PPO

## 2019-03-07 DIAGNOSIS — R6889 Other general symptoms and signs: Secondary | ICD-10-CM | POA: Diagnosis not present

## 2019-03-07 DIAGNOSIS — Z20822 Contact with and (suspected) exposure to covid-19: Secondary | ICD-10-CM

## 2019-03-07 NOTE — Telephone Encounter (Signed)
Called ans spoke with patient. Pt. Requested mail the letter to his home address. Letter mailed.

## 2019-03-07 NOTE — Telephone Encounter (Signed)
-----   Message from [facility] [place], [place] sent at 03/06/2019  3:22 PM EDT ----- Regarding: COVID 19 testing Patient with 2 days of abdominal pain, diarrhea, body aches. No known sick contacts [employer]

## 2019-03-07 NOTE — Telephone Encounter (Signed)
LM to return call to schedule covid testing @ [phone] M-F 7a-7p. Order placed.

## 2019-03-07 NOTE — Telephone Encounter (Signed)
Patient returned call and has been scheduled.

## 2019-03-09 NOTE — Assessment & Plan Note (Signed)
Continue current regimen, monitor home readings daily. Will fax order for home machine to medical supply. DASH diet, exercise.

## 2019-03-13 LAB — NOVEL CORONAVIRUS, NAA: SARS-CoV-2, NAA: NOT DETECTED

## 2019-03-14 ENCOUNTER — Telehealth: Payer: Self-pay | Admitting: Family Medicine

## 2019-03-14 ENCOUNTER — Other Ambulatory Visit: Payer: Self-pay | Admitting: Family Medicine

## 2019-03-14 NOTE — Telephone Encounter (Signed)
Letter generated, one copy handed to patient and one to [place] to fax

## 2019-03-14 NOTE — Telephone Encounter (Signed)
Pt presented in office stating that his job needs documentation stating that his Covid results are neg. He would like this letter faxed to [phone]. Please advise.

## 2019-03-22 ENCOUNTER — Other Ambulatory Visit: Payer: Self-pay | Admitting: Family Medicine

## 2019-03-24 ENCOUNTER — Encounter: Payer: Self-pay | Admitting: Family Medicine

## 2019-03-24 ENCOUNTER — Ambulatory Visit (INDEPENDENT_AMBULATORY_CARE_PROVIDER_SITE_OTHER): Payer: Federal, State, Local not specified - PPO | Admitting: Family Medicine

## 2019-03-24 ENCOUNTER — Telehealth: Payer: Self-pay | Admitting: Family Medicine

## 2019-03-24 ENCOUNTER — Other Ambulatory Visit: Payer: Self-pay

## 2019-03-24 VITALS — BP 181/114 | HR 55 | Ht 69.0 in | Wt 168.0 lb

## 2019-03-24 DIAGNOSIS — I10 Essential (primary) hypertension: Secondary | ICD-10-CM | POA: Diagnosis not present

## 2019-03-24 MED ORDER — LISINOPRIL 40 MG PO TABS: 40.0000 mg | ORAL_TABLET | Freq: Every day | ORAL | 0 refills | Status: DC

## 2019-03-24 NOTE — Progress Notes (Signed)
BP (!) 181/114 (BP Location: Left Arm, Patient Position: Sitting, Cuff Size: Normal)   Pulse (!) 55   Ht 5\' 9"  (1.753 m)   Wt 168 lb (76.2 kg)   BMI 24.81 kg/m    Subjective:    Patient ID: James Shaffer, male    DOB: 1965/10/06, 53 y.o.   MRN: [id number]  HPI: James Shaffer is a 53 y.o. male  Chief Complaint  Patient presents with  . Hypertension    . This visit was completed via WebEx due to the restrictions of the COVID-19 pandemic. All issues as above were discussed and addressed. Physical exam was done as above through visual confirmation on WebEx. If it was felt that the patient should be evaluated in the office, they were directed there. The patient verbally consented to this visit. . Location of the patient: home . Location of the provider: home . Those involved with this call:  . Provider: Merrie Roof, PA-C . CMA: Merilyn Baba, [place] . Front Desk/Registration: Jill Side  . Time spent on call: 15 minutes with patient face to face via video conference. More than 50% of this time was spent in counseling and coordination of care. 5 minutes total spent in review of patient's record and preparation of their chart. I verified patient identity using two factors (patient name and date of birth). Patient consents verbally to being seen via telemedicine visit today.   Presenting today for 2 week f/u HTN. States he ran out of 10 mg lisinopril so the past 4-5 days has just been taking some 5 mg tabs he had at home. Notes BPs have been significantly elevated between 150 - 180/100s. Denies CP, SOB, HAs, dizziness. Has been following DASH diet and exercising regularly.    Relevant past medical, surgical, family and social history reviewed and updated as indicated. Interim medical history since our last visit reviewed. Allergies and medications reviewed and updated.  Review of Systems  Per HPI unless specifically indicated above     Objective:    BP (!) 181/114 (BP Location:  Left Arm, Patient Position: Sitting, Cuff Size: Normal)   Pulse (!) 55   Ht 5\' 9"  (1.753 m)   Wt 168 lb (76.2 kg)   BMI 24.81 kg/m   Wt Readings from Last 3 Encounters:  03/24/19 168 lb (76.2 kg)  03/06/19 167 lb (75.8 kg)  10/25/18 170 lb 8 oz (77.3 kg)    Physical Exam Vitals signs and nursing note reviewed.  Constitutional:      General: He is not in acute distress.    Appearance: Normal appearance.  HENT:     Head: Atraumatic.     Right Ear: External ear normal.     Left Ear: External ear normal.     Nose: Nose normal. No congestion.     Mouth/Throat:     Mouth: Mucous membranes are moist.     Pharynx: Oropharynx is clear.  Eyes:     Extraocular Movements: Extraocular movements intact.     Conjunctiva/sclera: Conjunctivae normal.  Neck:     Musculoskeletal: Normal range of motion.  Pulmonary:     Effort: Pulmonary effort is normal. No respiratory distress.  Musculoskeletal: Normal range of motion.  Skin:    General: Skin is dry.     Findings: No erythema or rash.  Neurological:     Mental Status: He is oriented to person, place, and time.  Psychiatric:        Mood and Affect: Mood  normal.        Thought Content: Thought content normal.        Judgment: Judgment normal.     Results for orders placed or performed in visit on 03/07/19  Novel Coronavirus, NAA ([facility])  Result Value Ref Range   SARS-CoV-2, NAA Not Detected Not Detected      Assessment & Plan:   Problem List Items Addressed This Visit      Cardiovascular and Mediastinum   Essential hypertension - Primary    BPs significantly elevated. Increase to 40 mg lisinopril and closely monitor at home. Recheck in 2 weeks. Call with persistent abnormal readings or side effects in meantime      Relevant Medications   lisinopril (ZESTRIL) 40 MG tablet       Follow up plan: Return in about 2 weeks (around 04/07/2019) for BP f/u.

## 2019-03-24 NOTE — Telephone Encounter (Signed)
Pt calling requesting refill  lisinopril (ZESTRIL) 10 MG tablet  And an increase in his bp med.  Pt states he is out of his medication and his bp is consisently running high.  But he has had no medication.  Pt is at work today.  Can do a virtual visit.   Pt advised to follow up around 7/23 for bp management.   Called office for appt. Pt will need enough to egt through to his appt if it is not today.  [id number] [address], [place] [phone] (Phone) [phone] (Fax)

## 2019-03-24 NOTE — Telephone Encounter (Signed)
Medication refilled through virtual appointment today.

## 2019-03-24 NOTE — Assessment & Plan Note (Signed)
BPs significantly elevated. Increase to 40 mg lisinopril and closely monitor at home. Recheck in 2 weeks. Call with persistent abnormal readings or side effects in meantime

## 2019-04-10 ENCOUNTER — Ambulatory Visit: Payer: Self-pay | Admitting: Family Medicine

## 2019-04-10 ENCOUNTER — Ambulatory Visit (INDEPENDENT_AMBULATORY_CARE_PROVIDER_SITE_OTHER): Payer: Federal, State, Local not specified - PPO | Admitting: Family Medicine

## 2019-04-10 ENCOUNTER — Other Ambulatory Visit: Payer: Self-pay

## 2019-04-10 ENCOUNTER — Encounter: Payer: Self-pay | Admitting: Family Medicine

## 2019-04-10 VITALS — BP 163/100 | HR 58 | Ht 69.0 in | Wt 168.0 lb

## 2019-04-10 DIAGNOSIS — I10 Essential (primary) hypertension: Secondary | ICD-10-CM | POA: Diagnosis not present

## 2019-04-10 MED ORDER — AMLODIPINE BESYLATE 5 MG PO TABS: 5.0000 mg | ORAL_TABLET | Freq: Every day | ORAL | 0 refills | Status: DC

## 2019-04-10 MED ORDER — LISINOPRIL 40 MG PO TABS: 40.0000 mg | ORAL_TABLET | Freq: Every day | ORAL | 0 refills | Status: DC

## 2019-04-10 NOTE — Progress Notes (Signed)
BP (!) 163/100   Pulse (!) 58   Ht 5\' 9"  (1.753 m)   Wt 168 lb (76.2 kg)   BMI 24.81 kg/m    Subjective:    Patient ID: James Shaffer, male    DOB: [birth year], 53 y.o.   MRN: [id number]  HPI: James Shaffer is a 53 y.o. male  Chief Complaint  Patient presents with  . Hypertension    . This visit was completed via WebEx due to the restrictions of the COVID-19 pandemic. All issues as above were discussed and addressed. Physical exam was done as above through visual confirmation on WebEx. If it was felt that the patient should be evaluated in the office, they were directed there. The patient verbally consented to this visit. . Location of the patient: home . Location of the provider: work . Those involved with this call:  . Provider: Merrie Roof, PA-C . CMA: Lesle Chris, [place] . Front Desk/Registration: Jill Side  . Time spent on call: 15 minutes with patient face to face via video conference. More than 50% of this time was spent in counseling and coordination of care. 5 minutes total spent in review of patient's record and preparation of their chart. I verified patient identity using two factors (patient name and date of birth). Patient consents verbally to being seen via telemedicine visit today.   Here today for 2 week BP f/u after increasing lisinorpil to 40 mg. BPs were trending down into 130s/80s - 140s/90s. Now the past few days back up in the 160s/100s range. Taking his medicine faithfully without side effects. Denies CP, SOB, HAs, dizziness. Has been exercising regularly and eating well. Does have a lot of stress going on right now.     Relevant past medical, surgical, family and social history reviewed and updated as indicated. Interim medical history since our last visit reviewed. Allergies and medications reviewed and updated.  Review of Systems  Per HPI unless specifically indicated above     Objective:    BP (!) 163/100   Pulse (!) 58   Ht 5\' 9"  (1.753 m)    Wt 168 lb (76.2 kg)   BMI 24.81 kg/m   Wt Readings from Last 3 Encounters:  04/10/19 168 lb (76.2 kg)  03/24/19 168 lb (76.2 kg)  03/06/19 167 lb (75.8 kg)    Physical Exam Vitals signs and nursing note reviewed.  Constitutional:      General: He is not in acute distress.    Appearance: Normal appearance.  HENT:     Head: Atraumatic.     Right Ear: External ear normal.     Left Ear: External ear normal.     Nose: Nose normal. No congestion.     Mouth/Throat:     Mouth: Mucous membranes are moist.     Pharynx: Oropharynx is clear.  Eyes:     Extraocular Movements: Extraocular movements intact.     Conjunctiva/sclera: Conjunctivae normal.  Neck:     Musculoskeletal: Normal range of motion.  Pulmonary:     Effort: Pulmonary effort is normal. No respiratory distress.  Musculoskeletal: Normal range of motion.  Skin:    General: Skin is dry.     Findings: No erythema or rash.  Neurological:     Mental Status: He is oriented to person, place, and time.  Psychiatric:        Mood and Affect: Mood normal.        Thought Content: Thought content normal.  Judgment: Judgment normal.     Results for orders placed or performed in visit on 03/07/19  Novel Coronavirus, NAA ([facility])  Result Value Ref Range   SARS-CoV-2, NAA Not Detected Not Detected      Assessment & Plan:   Problem List Items Addressed This Visit      Cardiovascular and Mediastinum   Essential hypertension - Primary    BPs not at goal. Add amlodipine and continue home monitoring and good lifestyle habits. F/u in 1 month for recheck      Relevant Medications   amLODipine (NORVASC) 5 MG tablet   lisinopril (ZESTRIL) 40 MG tablet       Follow up plan: Return in about 1 month (around 05/11/2019) for BP f/u.

## 2019-04-14 NOTE — Assessment & Plan Note (Signed)
BPs not at goal. Add amlodipine and continue home monitoring and good lifestyle habits. F/u in 1 month for recheck

## 2019-04-15 ENCOUNTER — Other Ambulatory Visit: Payer: Self-pay | Admitting: Family Medicine

## 2019-05-02 ENCOUNTER — Other Ambulatory Visit: Payer: Self-pay | Admitting: Family Medicine

## 2019-05-02 NOTE — Telephone Encounter (Signed)
Courtesy refill. Needs appointment per last office visit note.

## 2019-05-13 ENCOUNTER — Encounter: Payer: Self-pay | Admitting: Family Medicine

## 2019-05-13 ENCOUNTER — Ambulatory Visit (INDEPENDENT_AMBULATORY_CARE_PROVIDER_SITE_OTHER): Payer: Federal, State, Local not specified - PPO | Admitting: Family Medicine

## 2019-05-13 ENCOUNTER — Other Ambulatory Visit: Payer: Self-pay

## 2019-05-13 VITALS — BP 164/94 | HR 70 | Ht 69.0 in | Wt 167.0 lb

## 2019-05-13 DIAGNOSIS — I10 Essential (primary) hypertension: Secondary | ICD-10-CM

## 2019-05-13 MED ORDER — LISINOPRIL 40 MG PO TABS: 40.0000 mg | ORAL_TABLET | Freq: Every day | ORAL | 1 refills | Status: DC

## 2019-05-13 MED ORDER — AMLODIPINE BESYLATE 5 MG PO TABS: 5.0000 mg | ORAL_TABLET | Freq: Every day | ORAL | 1 refills | Status: DC

## 2019-05-13 NOTE — Progress Notes (Signed)
BP (!) 164/94 (BP Location: Left Arm, Patient Position: Sitting, Cuff Size: Normal)   Pulse 70   Ht 5\' 9"  (1.753 m)   Wt 167 lb (75.8 kg)   BMI 24.66 kg/m    Subjective:    Patient ID: James Shaffer, male    DOB: 24-Apr-1966, 53 y.o.   MRN: [id number]  HPI: James Shaffer is a 52 y.o. male  Chief Complaint  Patient presents with  . Hypertension    Patient states the last few weeks his BP has been normal (120's/80's) but this week it has gone up.    . This visit was completed via WebEx due to the restrictions of the COVID-19 pandemic. All issues as above were discussed and addressed. Physical exam was done as above through visual confirmation on WebEx. If it was felt that the patient should be evaluated in the office, they were directed there. The patient verbally consented to this visit. . Location of the patient: home . Location of the provider: home . Those involved with this call:  . Provider: Merrie Roof, PA-C . CMA: Merilyn Baba, [place] . Front Desk/Registration: Jill Side  . Time spent on call: 15 minutes with patient face to face via video conference. More than 50% of this time was spent in counseling and coordination of care. 5 minutes total spent in review of patient's record and preparation of their chart. I verified patient identity using two factors (patient name and date of birth). Patient consents verbally to being seen via telemedicine visit today.   Patient presenting today for BP f/u after adding low dose amlodipine. BPs were in the 120s/80s each time he has checked the past month (about once weekly) since starting the new medicine. Readings now high both times he checked today. Denies side effects, CP, SOB, HAs, dizziness, routine changes, new stressors.   Relevant past medical, surgical, family and social history reviewed and updated as indicated. Interim medical history since our last visit reviewed. Allergies and medications reviewed and updated.  Review of  Systems  Per HPI unless specifically indicated above     Objective:    BP (!) 164/94 (BP Location: Left Arm, Patient Position: Sitting, Cuff Size: Normal)   Pulse 70   Ht 5\' 9"  (1.753 m)   Wt 167 lb (75.8 kg)   BMI 24.66 kg/m   Wt Readings from Last 3 Encounters:  05/13/19 167 lb (75.8 kg)  04/10/19 168 lb (76.2 kg)  03/24/19 168 lb (76.2 kg)    Physical Exam Vitals signs and nursing note reviewed.  Constitutional:      General: He is not in acute distress.    Appearance: Normal appearance.  HENT:     Head: Atraumatic.     Right Ear: External ear normal.     Left Ear: External ear normal.     Nose: Nose normal. No congestion.     Mouth/Throat:     Mouth: Mucous membranes are moist.     Pharynx: Oropharynx is clear.  Eyes:     Extraocular Movements: Extraocular movements intact.     Conjunctiva/sclera: Conjunctivae normal.  Neck:     Musculoskeletal: Normal range of motion.  Cardiovascular:     Rate and Rhythm: Normal rate and regular rhythm.  Pulmonary:     Effort: Pulmonary effort is normal. No respiratory distress.  Musculoskeletal: Normal range of motion.  Skin:    General: Skin is dry.     Findings: No erythema or rash.  Neurological:  Mental Status: He is oriented to person, place, and time.  Psychiatric:        Mood and Affect: Mood normal.        Thought Content: Thought content normal.        Judgment: Judgment normal.     Results for orders placed or performed in visit on 03/07/19  Novel Coronavirus, NAA ([facility])  Result Value Ref Range   SARS-CoV-2, NAA Not Detected Not Detected      Assessment & Plan:   Problem List Items Addressed This Visit      Cardiovascular and Mediastinum   Essential hypertension - Primary    BP elevated this morning, but has been WNL other times when checked since addition of amlodipine. Continue to work on [employer], exercise, stress control and closely monitor home readings. Continue current regimen and will  increase if persistently getting elevated readings      Relevant Medications   amLODipine (NORVASC) 5 MG tablet   lisinopril (ZESTRIL) 40 MG tablet       Follow up plan: Return in about 6 months (around 11/10/2019) for CPE.

## 2019-05-16 NOTE — Assessment & Plan Note (Signed)
BP elevated this morning, but has been WNL other times when checked since addition of amlodipine. Continue to work on [employer], exercise, stress control and closely monitor home readings. Continue current regimen and will increase if persistently getting elevated readings

## 2019-05-20 ENCOUNTER — Ambulatory Visit (INDEPENDENT_AMBULATORY_CARE_PROVIDER_SITE_OTHER): Payer: Federal, State, Local not specified - PPO | Admitting: Family Medicine

## 2019-05-20 ENCOUNTER — Encounter: Payer: Self-pay | Admitting: Family Medicine

## 2019-05-20 ENCOUNTER — Other Ambulatory Visit: Payer: Self-pay

## 2019-05-20 VITALS — Ht 69.0 in | Wt 167.0 lb

## 2019-05-20 DIAGNOSIS — M549 Dorsalgia, unspecified: Secondary | ICD-10-CM | POA: Diagnosis not present

## 2019-05-20 MED ORDER — PREDNISONE 20 MG PO TABS: 40.0000 mg | ORAL_TABLET | Freq: Every day | ORAL | 0 refills | Status: DC

## 2019-05-20 MED ORDER — TRAMADOL HCL 50 MG PO TABS: 50.0000 mg | ORAL_TABLET | Freq: Three times a day (TID) | ORAL | 0 refills | Status: DC | PRN

## 2019-05-20 MED ORDER — CYCLOBENZAPRINE HCL 10 MG PO TABS: 10.0000 mg | ORAL_TABLET | Freq: Three times a day (TID) | ORAL | 0 refills | Status: DC | PRN

## 2019-05-20 NOTE — Progress Notes (Signed)
Ht 5\' 9"  (1.753 m)   Wt 167 lb (75.8 kg)   BMI 24.66 kg/m    Subjective:    Patient ID: James Shaffer, male    DOB: 30-May-1966, 53 y.o.   MRN: [id number]  HPI: James Shaffer is a 53 y.o. male  Chief Complaint  Patient presents with  . Back Pain    Middle/right side of back near shoulder is painful. Patient states that his pain increased yesterday on the way home from the beach. Patient states it's to the point where he doesn't think he can work. Has tried ibuprofen and a friend gave him a muscle relaxer (doesn't know the name) with no relief.    . This visit was completed via WebEx due to the restrictions of the COVID-19 pandemic. All issues as above were discussed and addressed. Physical exam was done as above through visual confirmation on WebEx. If it was felt that the patient should be evaluated in the office, they were directed there. The patient verbally consented to this visit. . Location of the patient: home . Location of the provider: home . Those involved with this call:  . Provider: Merrie Roof, PA-C . CMA: Merilyn Baba, [place] . Front Desk/Registration: Jill Side  . Time spent on call: 20 minutes with patient face to face via video conference. More than 50% of this time was spent in counseling and coordination of care. 5 minutes total spent in review of patient's record and preparation of their chart. I verified patient identity using two factors (patient name and date of birth). Patient consents verbally to being seen via telemedicine visit today.   Upper back pain and knot in shoulder blade area on the right side the past few weeks, worse since yesterday. Pain is constant, both sharp and dull at the same time. No known injury, CP, SOB, diaphoresis, N/V/D, fevers. Tried massage therapy and muscle relaxer and NSAIDs with no relief.   Relevant past medical, surgical, family and social history reviewed and updated as indicated. Interim medical history since our last visit  reviewed. Allergies and medications reviewed and updated.  Review of Systems  Per HPI unless specifically indicated above     Objective:    Ht 5\' 9"  (1.753 m)   Wt 167 lb (75.8 kg)   BMI 24.66 kg/m   Wt Readings from Last 3 Encounters:  05/20/19 167 lb (75.8 kg)  05/13/19 167 lb (75.8 kg)  04/10/19 168 lb (76.2 kg)    Physical Exam Vitals signs and nursing note reviewed.  Constitutional:      Appearance: Normal appearance.     Comments: Patient in obvious discomfort, particularly when trying to stand up straight  HENT:     Head: Atraumatic.     Right Ear: External ear normal.     Left Ear: External ear normal.     Nose: Nose normal. No congestion.     Mouth/Throat:     Mouth: Mucous membranes are moist.     Pharynx: Oropharynx is clear.  Eyes:     Extraocular Movements: Extraocular movements intact.     Conjunctiva/sclera: Conjunctivae normal.  Neck:     Musculoskeletal: Normal range of motion.  Pulmonary:     Effort: Pulmonary effort is normal. No respiratory distress.  Musculoskeletal:     Comments: Antalgic movements of upper body States ttp upper right back near medial border of scapula on his self palpation   Skin:    General: Skin is dry.  Findings: No erythema or rash.  Neurological:     Mental Status: He is oriented to person, place, and time.  Psychiatric:        Mood and Affect: Mood normal.        Thought Content: Thought content normal.        Judgment: Judgment normal.     Results for orders placed or performed in visit on 03/07/19  Novel Coronavirus, NAA ([facility])  Result Value Ref Range   SARS-CoV-2, NAA Not Detected Not Detected      Assessment & Plan:   Problem List Items Addressed This Visit    None    Visit Diagnoses    Upper back pain    -  Primary   Suspect muscular, tx with prednisone burst, flexeril, tramadol and supportive care. Strict return precautions for in person eval if not improving   Relevant Medications    cyclobenzaprine (FLEXERIL) 10 MG tablet   predniSONE (DELTASONE) 20 MG tablet       Follow up plan: Return if symptoms worsen or fail to improve.

## 2019-05-21 ENCOUNTER — Ambulatory Visit
Admission: RE | Admit: 2019-05-21 | Discharge: 2019-05-21 | Disposition: A | Discharge status: Home / Self Care | Payer: Federal, State, Local not specified - PPO | Source: Ambulatory Visit | Attending: Family Medicine | Admitting: Family Medicine

## 2019-05-21 ENCOUNTER — Ambulatory Visit (INDEPENDENT_AMBULATORY_CARE_PROVIDER_SITE_OTHER): Payer: Federal, State, Local not specified - PPO | Admitting: Family Medicine

## 2019-05-21 ENCOUNTER — Encounter: Payer: Self-pay | Admitting: Family Medicine

## 2019-05-21 VITALS — BP 144/89 | HR 105 | Temp 98.4°F

## 2019-05-21 DIAGNOSIS — M25511 Pain in right shoulder: Secondary | ICD-10-CM | POA: Diagnosis not present

## 2019-05-21 DIAGNOSIS — M549 Dorsalgia, unspecified: Secondary | ICD-10-CM

## 2019-05-21 DIAGNOSIS — M4802 Spinal stenosis, cervical region: Secondary | ICD-10-CM | POA: Diagnosis not present

## 2019-05-21 DIAGNOSIS — M50322 Other cervical disc degeneration at C5-C6 level: Secondary | ICD-10-CM | POA: Diagnosis not present

## 2019-05-21 DIAGNOSIS — M50323 Other cervical disc degeneration at C6-C7 level: Secondary | ICD-10-CM | POA: Diagnosis not present

## 2019-05-21 MED ORDER — OXYCODONE-ACETAMINOPHEN 10-325 MG PO TABS: 1.0000 | ORAL_TABLET | Freq: Four times a day (QID) | ORAL | 0 refills | Status: DC | PRN

## 2019-05-21 MED ORDER — TRIAMCINOLONE ACETONIDE 40 MG/ML IJ SUSP
40.0000 mg | Freq: Once | INTRAMUSCULAR | Status: AC
  Administered 2019-05-21: 40 mg via INTRAMUSCULAR

## 2019-05-21 MED ORDER — KETOROLAC TROMETHAMINE 60 MG/2ML IM SOLN
60.0000 mg | Freq: Once | INTRAMUSCULAR | Status: AC
  Administered 2019-05-21: 60 mg via INTRAMUSCULAR

## 2019-05-21 NOTE — Progress Notes (Signed)
BP (!) 144/89   Pulse (!) 105   Temp 98.4 F (36.9 C) (Oral)   SpO2 100%    Subjective:    Patient ID: James Shaffer, male    DOB: July 19, 1966, 53 y.o.   MRN: [id number]  HPI: James Shaffer is a 53 y.o. male  Chief Complaint  Patient presents with  . Back Pain    upper back   Patient here today for worsening sharp stabbing pain that feels like it's right above his shoulder blade on the right upper back and shooting down arm. This has been worsening for about a week now since moving some heavy furniture. Having numbness and tingling developing down to right fingertips. Denies hx of neck or shoulder problems, past epsoides similar to this, swelling in arm. Trying OTC pain relievers, prednisone burst, tramadol and RICE protocol with no relief.   Relevant past medical, surgical, family and social history reviewed and updated as indicated. Interim medical history since our last visit reviewed. Allergies and medications reviewed and updated.  Review of Systems  Per HPI unless specifically indicated above     Objective:    BP (!) 144/89   Pulse (!) 105   Temp 98.4 F (36.9 C) (Oral)   SpO2 100%   Wt Readings from Last 3 Encounters:  05/20/19 167 lb (75.8 kg)  05/13/19 167 lb (75.8 kg)  04/10/19 168 lb (76.2 kg)    Physical Exam Vitals signs and nursing note reviewed.  Constitutional:      Comments: Pt in obvious discomfort  HENT:     Head: Atraumatic.  Eyes:     Extraocular Movements: Extraocular movements intact.     Conjunctiva/sclera: Conjunctivae normal.  Neck:     Musculoskeletal: Normal range of motion and neck supple.  Cardiovascular:     Rate and Rhythm: Normal rate and regular rhythm.     Pulses: Normal pulses.  Pulmonary:     Effort: Pulmonary effort is normal.     Breath sounds: Normal breath sounds.  Musculoskeletal:        General: Tenderness (ttp right upper back) present. No swelling.     Comments: ROM exam limited today by patient discomfort   Skin:    General: Skin is warm and dry.  Neurological:     General: No focal deficit present.     Mental Status: He is oriented to person, place, and time.     Sensory: Sensory deficit (mild sensory change to light touch right arm) present.     Motor: Weakness (right hand grip strength noticably less than left) present.  Psychiatric:        Mood and Affect: Mood normal.        Thought Content: Thought content normal.        Judgment: Judgment normal.     Results for orders placed or performed in visit on 03/07/19  Novel Coronavirus, NAA ([facility])  Result Value Ref Range   SARS-CoV-2, NAA Not Detected Not Detected      Assessment & Plan:   Problem List Items Addressed This Visit    None    Visit Diagnoses    Upper back pain    -  Primary   Relevant Medications   triamcinolone acetonide (KENALOG-40) injection 40 mg (Completed)   ketorolac (TORADOL) injection 60 mg (Completed)   Other Relevant Orders   EKG 12-Lead (Completed)   DG Cervical Spine Complete (Completed)   DG Shoulder Right (Completed)    EKG benign  today without any acute changes or abnormalities. Lungs CTAB and abdomen benign on exam. Suspect cervical radiculopathy causing his pain sxs, will obtain c spine and right shoulder x-rays, increase pain control regimen to oxycodone (pt aware to stop tramadol), and elevate steroid dose with a kenalog injection IM today. IM toradol also administered. Continue flexeril, oral prednisone, rest, heat/ice. Will refer to neurosurgery if sxs, particularly numbness down arm, become worse. Patient aware to go to ER if sxs worsening significantly at any point.   Greater than 25 minutes spent today in direct care and coordination with patient   Follow up plan: Return if symptoms worsen or fail to improve.

## 2019-05-23 ENCOUNTER — Telehealth: Payer: Self-pay | Admitting: Family Medicine

## 2019-05-23 DIAGNOSIS — M5412 Radiculopathy, cervical region: Secondary | ICD-10-CM

## 2019-05-23 MED ORDER — OXYCODONE-ACETAMINOPHEN 10-325 MG PO TABS: 1.0000 | ORAL_TABLET | Freq: Four times a day (QID) | ORAL | 0 refills | Status: AC | PRN

## 2019-05-23 NOTE — Telephone Encounter (Signed)
Medication: oxyCODONE-acetaminophen (PERCOCET) 10-325 MG tablet [id number] , cyclobenzaprine (FLEXERIL) 10 MG tablet [id number] , predniSONE (DELTASONE) 20 MG tablet [id number] ,   Has the patient contacted their pharmacy? Yes  (Agent: If no, request that the patient contact the pharmacy for the refill.) (Agent: If yes, when and what did the pharmacy advise?)  Preferred Pharmacy (with phone number or street name): [id number] [address], [place] [phone] (Phone) [phone] (Fax)    Agent: Please be advised that RX refills may take up to 3 business days. We ask that you follow-up with your pharmacy.

## 2019-05-23 NOTE — Telephone Encounter (Signed)
Spoke with patient again about what Apolonio Schneiders said.  Patient verbalized understanding.

## 2019-05-23 NOTE — Telephone Encounter (Signed)
Called pt to discuss abnormal c spine x-ray showing stenosis and spurring. His pain and numbness down arm is no better despite pain medication every 4-6 hours, prednisone, and toradol. Will place urgent referral and refill oxycodone to drop 5 days after previous script. Letter to be mailed writing him off work next week. ER precautions reviewed

## 2019-05-23 NOTE — Telephone Encounter (Signed)
Looks like patient can't fill Oxycodone until 05/26/2019. Patient shouldn't be out of flexeril and prednisone should last until the 09/27.  Just making sure this is correct before I call patient.

## 2019-05-23 NOTE — Telephone Encounter (Signed)
Patient aware that he won't be able to fill his pain medicine until the 28th, we did discuss this, and he should be good on the flexeril for at least another week and does not need to continue the prednisone further after course completed.

## 2019-05-23 NOTE — Telephone Encounter (Signed)
Patient is calling to check on the status of his imaging results. Patient states that his pain is also increasing. Please advise CB- [phone]

## 2019-05-26 ENCOUNTER — Other Ambulatory Visit: Payer: Self-pay | Admitting: Family Medicine

## 2019-05-26 NOTE — Telephone Encounter (Signed)
Patient is also requesting a refill on oxycodone

## 2019-05-26 NOTE — Telephone Encounter (Signed)
Requested medication (s) are due for refill today: yes  Requested medication (s) are on the active medication list: yes  Last refill:  05/20/2019  Future visit scheduled: no  Notes to clinic:  Refill cannot be delegated    Requested Prescriptions  Pending Prescriptions Disp Refills   cyclobenzaprine (FLEXERIL) 10 MG tablet [Pharmacy Med Name: CYCLOBENZAPRINE 10 MG TABLET] 30 tablet 0    Sig: TAKE 1 TABLET BY MOUTH THREE TIMES A DAY AS NEEDED FOR MUSCLE SPASMS     Not Delegated - Analgesics:  Muscle Relaxants Failed - 05/26/2019  1:10 PM      Failed - This refill cannot be delegated      Passed - Valid encounter within last 6 months    Recent Outpatient Visits          5 days ago Upper back pain   [facility] [place], [place]   6 days ago Upper back pain   [place], [place], [place]   1 week ago Essential hypertension   [place], [place], [place]   1 month ago Essential hypertension   [facility] Merrie Roof [place], [place]   2 months ago Essential hypertension   [place], [place], [place]

## 2019-05-29 DIAGNOSIS — M5412 Radiculopathy, cervical region: Secondary | ICD-10-CM | POA: Diagnosis not present

## 2019-06-04 DIAGNOSIS — M542 Cervicalgia: Secondary | ICD-10-CM | POA: Diagnosis not present

## 2019-06-04 DIAGNOSIS — M5412 Radiculopathy, cervical region: Secondary | ICD-10-CM | POA: Diagnosis not present

## 2019-06-06 DIAGNOSIS — M5412 Radiculopathy, cervical region: Secondary | ICD-10-CM | POA: Diagnosis not present

## 2019-06-06 DIAGNOSIS — M542 Cervicalgia: Secondary | ICD-10-CM | POA: Diagnosis not present

## 2019-06-10 DIAGNOSIS — M50323 Other cervical disc degeneration at C6-C7 level: Secondary | ICD-10-CM | POA: Diagnosis not present

## 2019-06-10 DIAGNOSIS — M5412 Radiculopathy, cervical region: Secondary | ICD-10-CM | POA: Diagnosis not present

## 2019-06-10 DIAGNOSIS — M50322 Other cervical disc degeneration at C5-C6 level: Secondary | ICD-10-CM | POA: Diagnosis not present

## 2019-06-10 DIAGNOSIS — M50221 Other cervical disc displacement at C4-C5 level: Secondary | ICD-10-CM | POA: Diagnosis not present

## 2019-06-11 DIAGNOSIS — M5412 Radiculopathy, cervical region: Secondary | ICD-10-CM | POA: Diagnosis not present

## 2019-06-11 DIAGNOSIS — M542 Cervicalgia: Secondary | ICD-10-CM | POA: Diagnosis not present

## 2019-06-13 DIAGNOSIS — M5412 Radiculopathy, cervical region: Secondary | ICD-10-CM | POA: Diagnosis not present

## 2019-06-13 DIAGNOSIS — M542 Cervicalgia: Secondary | ICD-10-CM | POA: Diagnosis not present

## 2019-06-18 DIAGNOSIS — M5412 Radiculopathy, cervical region: Secondary | ICD-10-CM | POA: Diagnosis not present

## 2019-06-18 DIAGNOSIS — M542 Cervicalgia: Secondary | ICD-10-CM | POA: Diagnosis not present

## 2019-06-19 DIAGNOSIS — M5412 Radiculopathy, cervical region: Secondary | ICD-10-CM | POA: Diagnosis not present

## 2019-06-20 DIAGNOSIS — M5412 Radiculopathy, cervical region: Secondary | ICD-10-CM | POA: Diagnosis not present

## 2019-06-20 DIAGNOSIS — M542 Cervicalgia: Secondary | ICD-10-CM | POA: Diagnosis not present

## 2019-06-23 DIAGNOSIS — M9901 Segmental and somatic dysfunction of cervical region: Secondary | ICD-10-CM | POA: Diagnosis not present

## 2019-06-23 DIAGNOSIS — M531 Cervicobrachial syndrome: Secondary | ICD-10-CM | POA: Diagnosis not present

## 2019-06-23 DIAGNOSIS — M50223 Other cervical disc displacement at C6-C7 level: Secondary | ICD-10-CM | POA: Diagnosis not present

## 2019-06-23 DIAGNOSIS — M50222 Other cervical disc displacement at C5-C6 level: Secondary | ICD-10-CM | POA: Diagnosis not present

## 2019-06-25 DIAGNOSIS — M542 Cervicalgia: Secondary | ICD-10-CM | POA: Diagnosis not present

## 2019-06-25 DIAGNOSIS — M50222 Other cervical disc displacement at C5-C6 level: Secondary | ICD-10-CM | POA: Diagnosis not present

## 2019-06-25 DIAGNOSIS — M9901 Segmental and somatic dysfunction of cervical region: Secondary | ICD-10-CM | POA: Diagnosis not present

## 2019-06-25 DIAGNOSIS — M50223 Other cervical disc displacement at C6-C7 level: Secondary | ICD-10-CM | POA: Diagnosis not present

## 2019-06-25 DIAGNOSIS — M5412 Radiculopathy, cervical region: Secondary | ICD-10-CM | POA: Diagnosis not present

## 2019-06-25 DIAGNOSIS — M531 Cervicobrachial syndrome: Secondary | ICD-10-CM | POA: Diagnosis not present

## 2019-06-27 DIAGNOSIS — M5412 Radiculopathy, cervical region: Secondary | ICD-10-CM | POA: Diagnosis not present

## 2019-06-27 DIAGNOSIS — M542 Cervicalgia: Secondary | ICD-10-CM | POA: Diagnosis not present

## 2019-06-30 ENCOUNTER — Other Ambulatory Visit: Payer: Self-pay

## 2019-06-30 DIAGNOSIS — M50223 Other cervical disc displacement at C6-C7 level: Secondary | ICD-10-CM | POA: Diagnosis not present

## 2019-06-30 DIAGNOSIS — M9901 Segmental and somatic dysfunction of cervical region: Secondary | ICD-10-CM | POA: Diagnosis not present

## 2019-06-30 DIAGNOSIS — M531 Cervicobrachial syndrome: Secondary | ICD-10-CM | POA: Diagnosis not present

## 2019-06-30 DIAGNOSIS — Z20822 Contact with and (suspected) exposure to covid-19: Secondary | ICD-10-CM

## 2019-06-30 DIAGNOSIS — M50222 Other cervical disc displacement at C5-C6 level: Secondary | ICD-10-CM | POA: Diagnosis not present

## 2019-07-01 LAB — NOVEL CORONAVIRUS, NAA: SARS-CoV-2, NAA: NOT DETECTED

## 2019-07-02 DIAGNOSIS — M542 Cervicalgia: Secondary | ICD-10-CM | POA: Diagnosis not present

## 2019-07-02 DIAGNOSIS — M5412 Radiculopathy, cervical region: Secondary | ICD-10-CM | POA: Diagnosis not present

## 2019-07-03 DIAGNOSIS — M531 Cervicobrachial syndrome: Secondary | ICD-10-CM | POA: Diagnosis not present

## 2019-07-03 DIAGNOSIS — M5412 Radiculopathy, cervical region: Secondary | ICD-10-CM | POA: Diagnosis not present

## 2019-07-03 DIAGNOSIS — R030 Elevated blood-pressure reading, without diagnosis of hypertension: Secondary | ICD-10-CM | POA: Diagnosis not present

## 2019-07-03 DIAGNOSIS — M5020 Other cervical disc displacement, unspecified cervical region: Secondary | ICD-10-CM | POA: Diagnosis not present

## 2019-07-03 DIAGNOSIS — M50222 Other cervical disc displacement at C5-C6 level: Secondary | ICD-10-CM | POA: Diagnosis not present

## 2019-07-03 DIAGNOSIS — M9901 Segmental and somatic dysfunction of cervical region: Secondary | ICD-10-CM | POA: Diagnosis not present

## 2019-07-03 DIAGNOSIS — M50223 Other cervical disc displacement at C6-C7 level: Secondary | ICD-10-CM | POA: Diagnosis not present

## 2019-07-04 DIAGNOSIS — M5412 Radiculopathy, cervical region: Secondary | ICD-10-CM | POA: Diagnosis not present

## 2019-07-04 DIAGNOSIS — M542 Cervicalgia: Secondary | ICD-10-CM | POA: Diagnosis not present

## 2019-07-09 DIAGNOSIS — M542 Cervicalgia: Secondary | ICD-10-CM | POA: Diagnosis not present

## 2019-07-09 DIAGNOSIS — M5412 Radiculopathy, cervical region: Secondary | ICD-10-CM | POA: Diagnosis not present

## 2019-11-10 ENCOUNTER — Other Ambulatory Visit: Payer: Self-pay | Admitting: Family Medicine

## 2019-11-10 NOTE — Telephone Encounter (Signed)
Requested Prescriptions  Pending Prescriptions Disp Refills  . amLODipine (NORVASC) 5 MG tablet [Pharmacy Med Name: AMLODIPINE BESYLATE 5 MG TAB] 90 tablet 0    Sig: TAKE 1 TABLET BY MOUTH EVERY DAY     Cardiovascular:  Calcium Channel Blockers Failed - 11/10/2019  3:12 PM      Failed - Last BP in normal range    BP Readings from Last 1 Encounters:  05/21/19 (!) 144/89         Passed - Valid encounter within last 6 months    Recent Outpatient Visits          5 months ago Upper back pain   [place], [place]   5 months ago Upper back pain   [place], [place], [place]   6 months ago Essential hypertension   [place], [place], [place]   7 months ago Essential hypertension   [place], [place], [place]   7 months ago Essential hypertension   [place], Lilia Argue, [place]      Future Appointments            In 1 month Orene Desanctis, Lilia Argue, [place], [place]           . lisinopril (ZESTRIL) 40 MG tablet [Pharmacy Med Name: LISINOPRIL 40 MG TABLET] 90 tablet 0    Sig: TAKE 1 TABLET BY MOUTH EVERY DAY     Cardiovascular:  ACE Inhibitors Failed - 11/10/2019  3:12 PM      Failed - Cr in normal range and within 180 days    Creatinine, Ser  Date Value Ref Range Status  10/18/2018 1.42 (H) 0.76 - 1.27 mg/dL Final         Failed - K in normal range and within 180 days    Potassium  Date Value Ref Range Status  10/18/2018 4.9 3.5 - 5.2 mmol/L Final         Failed - Last BP in normal range    BP Readings from Last 1 Encounters:  05/21/19 (!) 144/89         Passed - Patient is not pregnant      Passed - Valid encounter within last 6 months    Recent Outpatient Visits          5 months ago Upper back pain   [facility] [place], [place]   5 months ago Upper back pain   [facility]  [place], [place]   6 months ago Essential hypertension   [place], [place], [place]   7 months ago Essential hypertension   [place], [place], [place]   7 months ago Essential hypertension   [place], Lilia Arg[place], [place]      Future Appointments            In 1 month Orene Desanctis, Lilia Argue, [place], [facility]

## 2019-11-22 ENCOUNTER — Other Ambulatory Visit: Payer: Self-pay | Admitting: Family Medicine

## 2019-12-26 ENCOUNTER — Encounter: Payer: Self-pay | Admitting: Family Medicine

## 2019-12-26 ENCOUNTER — Ambulatory Visit (INDEPENDENT_AMBULATORY_CARE_PROVIDER_SITE_OTHER): Payer: Federal, State, Local not specified - PPO | Admitting: Family Medicine

## 2019-12-26 ENCOUNTER — Other Ambulatory Visit: Payer: Self-pay

## 2019-12-26 VITALS — BP 132/80 | HR 74 | Temp 97.7°F | Ht 67.5 in | Wt 170.0 lb

## 2019-12-26 DIAGNOSIS — Z136 Encounter for screening for cardiovascular disorders: Secondary | ICD-10-CM | POA: Diagnosis not present

## 2019-12-26 DIAGNOSIS — I10 Essential (primary) hypertension: Secondary | ICD-10-CM | POA: Diagnosis not present

## 2019-12-26 DIAGNOSIS — Z0000 Encounter for general adult medical examination without abnormal findings: Secondary | ICD-10-CM | POA: Diagnosis not present

## 2019-12-26 DIAGNOSIS — Z1211 Encounter for screening for malignant neoplasm of colon: Secondary | ICD-10-CM

## 2019-12-26 LAB — UA/M W/RFLX CULTURE, ROUTINE
Bilirubin, UA: NEGATIVE
Glucose, UA: NEGATIVE
Ketones, UA: NEGATIVE
Leukocytes,UA: NEGATIVE
Nitrite, UA: NEGATIVE
Protein,UA: NEGATIVE
RBC, UA: NEGATIVE
Specific Gravity, UA: 1.02 (ref 1.005–1.030)
Urobilinogen, Ur: 0.2 mg/dL (ref 0.2–1.0)
pH, UA: 6 (ref 5.0–7.5)

## 2019-12-26 MED ORDER — AMLODIPINE BESYLATE 5 MG PO TABS: 5.0000 mg | ORAL_TABLET | Freq: Every day | ORAL | 1 refills | Status: DC

## 2019-12-26 MED ORDER — LISINOPRIL 40 MG PO TABS: 40.0000 mg | ORAL_TABLET | Freq: Every day | ORAL | 1 refills | Status: DC

## 2019-12-26 NOTE — Assessment & Plan Note (Signed)
BPs stable and under good control, continue current regimen 

## 2019-12-26 NOTE — Progress Notes (Signed)
BP 132/80   Pulse 74   Temp 97.7 F (36.5 C) (Oral)   Ht 5' 7.5" (1.715 m)   Wt 170 lb (77.1 kg)   SpO2 99%   BMI 26.23 kg/m    Subjective:    Patient ID: James Shaffer, male    DOB: [birth year], 54 y.o.   MRN: [id number]  HPI: James Shaffer is a 54 y.o. male presenting on 12/26/2019 for comprehensive medical examination. Current medical complaints include:see below  Home BPs running high every once in a while (150s/90s) but overall running 130s/80s. Tolerating medication well without side effects. Denies CP, SOB, HAs, dizziness.   He currently lives with: Interim Problems from his last visit: no  Depression Screen done today and results listed below:  Depression screen [facility] 2/9 12/26/2019 10/18/2018  Decreased Interest 1 0  Down, Depressed, Hopeless 2 0  PHQ - 2 Score 3 0  Altered sleeping 1 0  Tired, decreased energy 1 0  Change in appetite 0 0  Feeling bad or failure about yourself  2 0  Trouble concentrating 0 0  Moving slowly or fidgety/restless 0 0  Suicidal thoughts 0 0  PHQ-9 Score 7 0  Difficult doing work/chores - Not difficult at all    The patient does not have a history of falls. I did complete a risk assessment for falls. A plan of care for falls was documented.   Past Medical History:  Past Medical History:  Diagnosis Date  . Hodgkin's lymphoma ([place])   . Kidney stones     Surgical History:  Past Surgical History:  Procedure Laterality Date  . BONE MARROW BIOPSY     hodgkins lymphoma  . VASECTOMY      Medications:  No current outpatient medications on file prior to visit.   No current facility-administered medications on file prior to visit.    Allergies:  No Known Allergies  Social History:  Social History   Socioeconomic History  . Marital status: Single    Spouse name: Not on file  . Number of children: Not on file  . Years of education: Not on file  . Highest education level: Not on file  Occupational History  . Not on file    Tobacco Use  . Smoking status: Never Smoker  . Smokeless tobacco: Never Used  Substance and Sexual Activity  . Alcohol use: Yes    Alcohol/week: 25.0 standard drinks    Types: 25 Shots of liquor per week    Comment: 3 nights per week  . Drug use: No  . Sexual activity: Yes  Other Topics Concern  . Not on file  Social History Narrative  . Not on file   Social Determinants of Health   Financial Resource Strain:   . Difficulty of Paying Living Expenses:   Food Insecurity:   . Worried About Charity fundraiser in the Last Year:   . Arboriculturist in the Last Year:   Transportation Needs:   . Film/video editor (Medical):   Marland Kitchen Lack of Transportation (Non-Medical):   Physical Activity:   . Days of Exercise per Week:   . Minutes of Exercise per Session:   Stress:   . Feeling of Stress :   Social Connections:   . Frequency of Communication with Friends and Family:   . Frequency of Social Gatherings with Friends and Family:   . Attends Religious Services:   . Active Member of Clubs or Organizations:   .  Attends Archivist Meetings:   Marland Kitchen Marital Status:   Intimate Partner Violence:   . Fear of Current or Ex-Partner:   . Emotionally Abused:   Marland Kitchen Physically Abused:   . Sexually Abused:    Social History   Tobacco Use  Smoking Status Never Smoker  Smokeless Tobacco Never Used   Social History   Substance and Sexual Activity  Alcohol Use Yes  . Alcohol/week: 25.0 standard drinks  . Types: 25 Shots of liquor per week   Comment: 3 nights per week    Family History:  Family History  Problem Relation Age of Onset  . Cancer Mother        lung  . Kidney Stones Maternal Grandfather   . Kidney cancer Neg Hx   . Bladder Cancer Neg Hx   . Prostate cancer Neg Hx     Past medical history, surgical history, medications, allergies, family history and social history reviewed with patient today and changes made to appropriate areas of the chart.   Review of  Systems - General ROS: negative Psychological ROS: negative Ophthalmic ROS: negative ENT ROS: negative Allergy and Immunology ROS: negative Hematological and Lymphatic ROS: negative Endocrine ROS: negative Breast ROS: negative for breast lumps Respiratory ROS: no cough, shortness of breath, or wheezing Cardiovascular ROS: no chest pain or dyspnea on exertion Gastrointestinal ROS: no abdominal pain, change in bowel habits, or black or bloody stools Genito-Urinary ROS: no dysuria, trouble voiding, or hematuria Musculoskeletal ROS: negative Neurological ROS: no TIA or stroke symptoms Dermatological ROS: negative All other ROS negative except what is listed above and in the HPI.      Objective:    BP 132/80   Pulse 74   Temp 97.7 F (36.5 C) (Oral)   Ht 5' 7.5" (1.715 m)   Wt 170 lb (77.1 kg)   SpO2 99%   BMI 26.23 kg/m   Wt Readings from Last 3 Encounters:  12/26/19 170 lb (77.1 kg)  05/20/19 167 lb (75.8 kg)  05/13/19 167 lb (75.8 kg)    Physical Exam Vitals and nursing note reviewed.  Constitutional:      General: He is not in acute distress.    Appearance: He is well-developed.  HENT:     Head: Atraumatic.     Right Ear: Tympanic membrane and external ear normal.     Left Ear: Tympanic membrane and external ear normal.     Nose: Nose normal.     Mouth/Throat:     Mouth: Mucous membranes are moist.     Pharynx: Oropharynx is clear.  Eyes:     General: No scleral icterus.    Conjunctiva/sclera: Conjunctivae normal.     Pupils: Pupils are equal, round, and reactive to light.  Cardiovascular:     Rate and Rhythm: Normal rate and regular rhythm.     Heart sounds: Normal heart sounds. No murmur.  Pulmonary:     Effort: Pulmonary effort is normal. No respiratory distress.     Breath sounds: Normal breath sounds.  Abdominal:     General: Bowel sounds are normal. There is no distension.     Palpations: Abdomen is soft. There is no mass.     Tenderness: There is no  abdominal tenderness. There is no guarding.  Genitourinary:    Comments: GU exam declined Musculoskeletal:        General: No tenderness. Normal range of motion.     Cervical back: Normal range of motion and neck supple.  Skin:    General: Skin is warm and dry.     Findings: No rash.  Neurological:     General: No focal deficit present.     Mental Status: He is alert and oriented to person, place, and time.     Deep Tendon Reflexes: Reflexes are normal and symmetric.  Psychiatric:        Mood and Affect: Mood normal.        Behavior: Behavior normal.        Thought Content: Thought content normal.        Judgment: Judgment normal.    Results for orders placed or performed in visit on 06/30/19  Novel Coronavirus, NAA ([facility])   Specimen: Oropharyngeal(OP) collection in vial transport medium   OROPHARYNGEA  TESTING  Result Value Ref Range   SARS-CoV-2, NAA Not Detected Not Detected      Assessment & Plan:   Problem List Items Addressed This Visit      Cardiovascular and Mediastinum   Essential hypertension    BPs stable and under good control, continue current regimen.       Relevant Medications   lisinopril (ZESTRIL) 40 MG tablet   amLODipine (NORVASC) 5 MG tablet    Other Visit Diagnoses    Annual physical exam    -  Primary   Relevant Orders   CBC with Differential/Platelet   Comprehensive metabolic panel   Lipid Panel w/o Chol/HDL Ratio   TSH   UA/M w/rflx Culture, Routine   Screening for colon cancer       Relevant Orders   Cologuard       Discussed aspirin prophylaxis for myocardial infarction prevention and decision was it was not indicated  LABORATORY TESTING:  Health maintenance labs ordered today as discussed above.   The natural history of prostate cancer and ongoing controversy regarding screening and potential treatment outcomes of prostate cancer has been discussed with the patient. The meaning of a false positive PSA and a false negative PSA  has been discussed. He indicates understanding of the limitations of this screening test and wishes not to proceed with screening PSA testing.   IMMUNIZATIONS:   - Tdap: Tetanus vaccination status reviewed: last tetanus booster within 10 years. - Influenza: Postponed to flu season  SCREENING: - Colonoscopy: cologuard ordered  Discussed with patient purpose of the colonoscopy is to detect colon cancer at curable precancerous or early stages   PATIENT COUNSELING:    Sexuality: Discussed sexually transmitted diseases, partner selection, use of condoms, avoidance of unintended pregnancy  and contraceptive alternatives.   Advised to avoid cigarette smoking.  I discussed with the patient that most people either abstain from alcohol or drink within safe limits (<=14/week and <=4 drinks/occasion for males, <=7/weeks and <= 3 drinks/occasion for females) and that the risk for alcohol disorders and other health effects rises proportionally with the number of drinks per week and how often a drinker exceeds daily limits.  Discussed cessation/primary prevention of drug use and availability of treatment for abuse.   Diet: Encouraged to adjust caloric intake to maintain  or achieve ideal body weight, to reduce intake of dietary saturated fat and total fat, to limit sodium intake by avoiding high sodium foods and not adding table salt, and to maintain adequate dietary potassium and calcium preferably from fresh fruits, vegetables, and low-fat dairy products.    stressed the importance of regular exercise  Injury prevention: Discussed safety belts, safety helmets, smoke detector, smoking near bedding or upholstery.  Dental health: Discussed importance of regular tooth brushing, flossing, and dental visits.   Follow up plan: NEXT PREVENTATIVE PHYSICAL DUE IN 1 YEAR. Return in about 6 months (around 06/26/2020) for 6 month f/u.

## 2019-12-27 LAB — COMPREHENSIVE METABOLIC PANEL
ALT: 34 IU/L (ref 0–44)
AST: 36 IU/L (ref 0–40)
Albumin/Globulin Ratio: 1.7 (ref 1.2–2.2)
Albumin: 4.2 g/dL (ref 3.8–4.9)
Alkaline Phosphatase: 77 IU/L (ref 39–117)
BUN/Creatinine Ratio: 25 — ABNORMAL HIGH (ref 9–20)
BUN: 26 mg/dL — ABNORMAL HIGH (ref 6–24)
Bilirubin Total: 0.2 mg/dL (ref 0.0–1.2)
CO2: 24 mmol/L (ref 20–29)
Calcium: 9.2 mg/dL (ref 8.7–10.2)
Chloride: 100 mmol/L (ref 96–106)
Creatinine, Ser: 1.04 mg/dL (ref 0.76–1.27)
GFR calc Af Amer: 94 mL/min/{1.73_m2} (ref >59)
GFR calc non Af Amer: 81 mL/min/{1.73_m2} (ref >59)
Globulin, Total: 2.5 g/dL (ref 1.5–4.5)
Glucose: 71 mg/dL (ref 65–99)
Potassium: 3.7 mmol/L (ref 3.5–5.2)
Sodium: 137 mmol/L (ref 134–144)
Total Protein: 6.7 g/dL (ref 6.0–8.5)

## 2019-12-27 LAB — CBC WITH DIFFERENTIAL/PLATELET
Basophils Absolute: 0 10*3/uL (ref 0.0–0.2)
Basos: 1 %
EOS (ABSOLUTE): 0.1 10*3/uL (ref 0.0–0.4)
Eos: 2 %
Hematocrit: 42.2 % (ref 37.5–51.0)
Hemoglobin: 14 g/dL (ref 13.0–17.7)
Immature Grans (Abs): 0 10*3/uL (ref 0.0–0.1)
Immature Granulocytes: 0 %
Lymphocytes Absolute: 1.7 10*3/uL (ref 0.7–3.1)
Lymphs: 28 %
MCH: 32.9 pg (ref 26.6–33.0)
MCHC: 33.2 g/dL (ref 31.5–35.7)
MCV: 99 fL — ABNORMAL HIGH (ref 79–97)
Monocytes Absolute: 0.5 10*3/uL (ref 0.1–0.9)
Monocytes: 8 %
Neutrophils Absolute: 3.8 10*3/uL (ref 1.4–7.0)
Neutrophils: 61 %
Platelets: 385 10*3/uL (ref 150–450)
RBC: 4.25 x10E6/uL (ref 4.14–5.80)
RDW: 12.2 % (ref 11.6–15.4)
WBC: 6.1 10*3/uL (ref 3.4–10.8)

## 2019-12-27 LAB — LIPID PANEL W/O CHOL/HDL RATIO
Cholesterol, Total: 171 mg/dL (ref 100–199)
HDL: 53 mg/dL (ref >39)
LDL Chol Calc (NIH): 96 mg/dL (ref 0–99)
Triglycerides: 126 mg/dL (ref 0–149)
VLDL Cholesterol Cal: 22 mg/dL (ref 5–40)

## 2019-12-27 LAB — TSH: TSH: 3.11 u[IU]/mL (ref 0.450–4.500)

## 2020-06-28 ENCOUNTER — Ambulatory Visit: Payer: Federal, State, Local not specified - PPO | Admitting: Nurse Practitioner

## 2020-06-28 ENCOUNTER — Ambulatory Visit: Payer: Federal, State, Local not specified - PPO | Admitting: Family Medicine

## 2020-07-02 ENCOUNTER — Other Ambulatory Visit: Payer: Self-pay | Admitting: Family Medicine

## 2020-07-05 ENCOUNTER — Other Ambulatory Visit: Payer: Self-pay

## 2020-07-05 ENCOUNTER — Ambulatory Visit: Payer: Federal, State, Local not specified - PPO | Admitting: Nurse Practitioner

## 2020-07-05 ENCOUNTER — Encounter: Payer: Self-pay | Admitting: Nurse Practitioner

## 2020-07-05 VITALS — BP 117/76 | HR 81 | Temp 98.9°F | Wt 157.6 lb

## 2020-07-05 DIAGNOSIS — I10 Essential (primary) hypertension: Secondary | ICD-10-CM

## 2020-07-05 DIAGNOSIS — Z113 Encounter for screening for infections with a predominantly sexual mode of transmission: Secondary | ICD-10-CM | POA: Diagnosis not present

## 2020-07-05 MED ORDER — AMLODIPINE BESYLATE 5 MG PO TABS: 5.0000 mg | ORAL_TABLET | Freq: Every day | ORAL | 1 refills | Status: DC

## 2020-07-05 MED ORDER — LISINOPRIL 40 MG PO TABS: 40.0000 mg | ORAL_TABLET | Freq: Every day | ORAL | 1 refills | Status: DC

## 2020-07-05 NOTE — Patient Instructions (Signed)
DASH Eating Plan DASH stands for "Dietary Approaches to Stop Hypertension." The DASH eating plan is a healthy eating plan that has been shown to reduce high blood pressure (hypertension). It may also reduce your risk for type 2 diabetes, heart disease, and stroke. The DASH eating plan may also help with weight loss. What are tips for following this plan?  General guidelines  Avoid eating more than 2,300 mg (milligrams) of salt (sodium) a day. If you have hypertension, you may need to reduce your sodium intake to 1,500 mg a day.  Limit alcohol intake to no more than 1 drink a day for nonpregnant women and 2 drinks a day for men. One drink equals 12 oz of beer, 5 oz of wine, or 1 oz of hard liquor.  Work with your health care provider to maintain a healthy body weight or to lose weight. Ask what an ideal weight is for you.  Get at least 30 minutes of exercise that causes your heart to beat faster (aerobic exercise) most days of the week. Activities may include walking, swimming, or biking.  Work with your health care provider or diet and nutrition specialist (dietitian) to adjust your eating plan to your individual calorie needs. Reading food labels   Check food labels for the amount of sodium per serving. Choose foods with less than 5 percent of the Daily Value of sodium. Generally, foods with less than 300 mg of sodium per serving fit into this eating plan.  To find whole grains, look for the word "whole" as the first word in the ingredient list. Shopping  Buy products labeled as "low-sodium" or "no salt added."  Buy fresh foods. Avoid canned foods and premade or frozen meals. Cooking  Avoid adding salt when cooking. Use salt-free seasonings or herbs instead of table salt or sea salt. Check with your health care provider or pharmacist before using salt substitutes.  Do not fry foods. Cook foods using healthy methods such as baking, boiling, grilling, and broiling instead.  Cook with  heart-healthy oils, such as olive, canola, soybean, or sunflower oil. Meal planning  Eat a balanced diet that includes: ? 5 or more servings of fruits and vegetables each day. At each meal, try to fill half of your plate with fruits and vegetables. ? Up to 6-8 servings of whole grains each day. ? Less than 6 oz of lean meat, poultry, or fish each day. A 3-oz serving of meat is about the same size as a deck of cards. One egg equals 1 oz. ? 2 servings of low-fat dairy each day. ? A serving of nuts, seeds, or beans 5 times each week. ? Heart-healthy fats. Healthy fats called Omega-3 fatty acids are found in foods such as flaxseeds and coldwater fish, like sardines, salmon, and mackerel.  Limit how much you eat of the following: ? Canned or prepackaged foods. ? Food that is high in trans fat, such as fried foods. ? Food that is high in saturated fat, such as fatty meat. ? Sweets, desserts, sugary drinks, and other foods with added sugar. ? Full-fat dairy products.  Do not salt foods before eating.  Try to eat at least 2 vegetarian meals each week.  Eat more home-cooked food and less restaurant, buffet, and fast food.  When eating at a restaurant, ask that your food be prepared with less salt or no salt, if possible. What foods are recommended? The items listed may not be a complete list. Talk with your dietitian about   what dietary choices are best for you. Grains Whole-grain or whole-wheat bread. Whole-grain or whole-wheat pasta. Brown rice. Oatmeal. Quinoa. Bulgur. Whole-grain and low-sodium cereals. Pita bread. Low-fat, low-sodium crackers. Whole-wheat flour tortillas. Vegetables Fresh or frozen vegetables (raw, steamed, roasted, or grilled). Low-sodium or reduced-sodium tomato and vegetable juice. Low-sodium or reduced-sodium tomato sauce and tomato paste. Low-sodium or reduced-sodium canned vegetables. Fruits All fresh, dried, or frozen fruit. Canned fruit in natural juice (without  added sugar). Meat and other protein foods Skinless chicken or turkey. Ground chicken or turkey. Pork with fat trimmed off. Fish and seafood. Egg whites. Dried beans, peas, or lentils. Unsalted nuts, nut butters, and seeds. Unsalted canned beans. Lean cuts of beef with fat trimmed off. Low-sodium, lean deli meat. Dairy Low-fat (1%) or fat-free (skim) milk. Fat-free, low-fat, or reduced-fat cheeses. Nonfat, low-sodium ricotta or cottage cheese. Low-fat or nonfat yogurt. Low-fat, low-sodium cheese. Fats and oils Soft margarine without trans fats. Vegetable oil. Low-fat, reduced-fat, or light mayonnaise and salad dressings (reduced-sodium). Canola, safflower, olive, soybean, and sunflower oils. Avocado. Seasoning and other foods Herbs. Spices. Seasoning mixes without salt. Unsalted popcorn and pretzels. Fat-free sweets. What foods are not recommended? The items listed may not be a complete list. Talk with your dietitian about what dietary choices are best for you. Grains Baked goods made with fat, such as croissants, muffins, or some breads. Dry pasta or rice meal packs. Vegetables Creamed or fried vegetables. Vegetables in a cheese sauce. Regular canned vegetables (not low-sodium or reduced-sodium). Regular canned tomato sauce and paste (not low-sodium or reduced-sodium). Regular tomato and vegetable juice (not low-sodium or reduced-sodium). Pickles. Olives. Fruits Canned fruit in a light or heavy syrup. Fried fruit. Fruit in cream or butter sauce. Meat and other protein foods Fatty cuts of meat. Ribs. Fried meat. Bacon. Sausage. Bologna and other processed lunch meats. Salami. Fatback. Hotdogs. Bratwurst. Salted nuts and seeds. Canned beans with added salt. Canned or smoked fish. Whole eggs or egg yolks. Chicken or turkey with skin. Dairy Whole or 2% milk, cream, and half-and-half. Whole or full-fat cream cheese. Whole-fat or sweetened yogurt. Full-fat cheese. Nondairy creamers. Whipped toppings.  Processed cheese and cheese spreads. Fats and oils Butter. Stick margarine. Lard. Shortening. Ghee. Bacon fat. Tropical oils, such as coconut, palm kernel, or palm oil. Seasoning and other foods Salted popcorn and pretzels. Onion salt, garlic salt, seasoned salt, table salt, and sea salt. Worcestershire sauce. Tartar sauce. Barbecue sauce. Teriyaki sauce. Soy sauce, including reduced-sodium. Steak sauce. Canned and packaged gravies. Fish sauce. Oyster sauce. Cocktail sauce. Horseradish that you find on the shelf. Ketchup. Mustard. Meat flavorings and tenderizers. Bouillon cubes. Hot sauce and Tabasco sauce. Premade or packaged marinades. Premade or packaged taco seasonings. Relishes. Regular salad dressings. Where to find more information:  National Heart, Lung, and Blood Institute: www.nhlbi.nih.gov  American Heart Association: www.heart.org Summary  The DASH eating plan is a healthy eating plan that has been shown to reduce high blood pressure (hypertension). It may also reduce your risk for type 2 diabetes, heart disease, and stroke.  With the DASH eating plan, you should limit salt (sodium) intake to 2,300 mg a day. If you have hypertension, you may need to reduce your sodium intake to 1,500 mg a day.  When on the DASH eating plan, aim to eat more fresh fruits and vegetables, whole grains, lean proteins, low-fat dairy, and heart-healthy fats.  Work with your health care provider or diet and nutrition specialist (dietitian) to adjust your eating plan to your   individual calorie needs. This information is not intended to replace advice given to you by your health care provider. Make sure you discuss any questions you have with your health care provider. Document Revised: 07/27/2017 Document Reviewed: 08/07/2016 Elsevier Patient Education  2020 [employer] Inc.  

## 2020-07-05 NOTE — Assessment & Plan Note (Signed)
Chronic, stable.  Blood pressure at goal of <130/80 today in office.  We will continue amlodipine 5 mg and lisinopril 40 mg for now, refills sent in.  BMET checked today.  Follow-up in 6 months or sooner if needs arise.

## 2020-07-05 NOTE — Progress Notes (Signed)
BP 117/76    Pulse 81    Temp 98.9 F (37.2 C) (Oral)    Wt 157 lb 9.6 oz (71.5 kg)    SpO2 97%    BMI 24.32 kg/m    Subjective:    Patient ID: James Shaffer, male    DOB: [birth year], 54 y.o.   MRN: [id number]  HPI: James Shaffer is a 53 y.o. male presenting for hypertension follow up.  Chief Complaint  Patient presents with   Hypertension    6 month f/up   HYPERTENSION Hypertension status: controlled  Satisfied with current treatment? yes Duration of hypertension: chronic BP monitoring frequency:  rarely BP medication side effects:  no Medication compliance: excellent compliance Previous BP meds: amlodipine 5 mg and lisinopril 40 mg Aspirin: no Recurrent headaches: no Visual changes: no Palpitations: no Dyspnea: no Chest pain: no Lower extremity edema: no Dizzy/lightheaded: no   STD SCREENING Requesting STD screening today. Sexual activity:  Recent unprotected sexual encounter Contraception: no Recent unprotected intercourse: yes History of sexually transmitted diseases: no Previous sexually transmitted disease screening: no Genital lesions: no Penile discharge: no Dysuria: no Swollen lymph nodes: no Fevers: no Rash: no  No Known Allergies  Outpatient Encounter Medications as of 07/05/2020  Medication Sig   amLODipine (NORVASC) 5 MG tablet Take 1 tablet (5 mg total) by mouth daily.   lisinopril (ZESTRIL) 40 MG tablet Take 1 tablet (40 mg total) by mouth daily.   [DISCONTINUED] amLODipine (NORVASC) 5 MG tablet TAKE 1 TABLET BY MOUTH EVERY DAY   [DISCONTINUED] lisinopril (ZESTRIL) 40 MG tablet TAKE 1 TABLET BY MOUTH EVERY DAY   No facility-administered encounter medications on file as of 07/05/2020.   Patient Active Problem List   Diagnosis Date Noted   Essential hypertension 10/19/2018   Past Medical History:  Diagnosis Date   Hodgkin's lymphoma [facility])    Kidney stones    Relevant past medical, surgical, family and social history reviewed and  updated as indicated. Interim medical history since our last visit reviewed.  Review of Systems  Constitutional: Negative.  Negative for activity change, appetite change, fatigue and fever.  Eyes: Negative.  Negative for visual disturbance.  Respiratory: Negative.  Negative for chest tightness and shortness of breath.   Cardiovascular: Negative.  Negative for chest pain, palpitations and leg swelling.  Gastrointestinal: Negative.  Negative for nausea and vomiting.  Skin: Negative.  Negative for rash.  Neurological: Negative.  Negative for dizziness, weakness and light-headedness.  Psychiatric/Behavioral: Negative.     Per HPI unless specifically indicated above     Objective:    BP 117/76    Pulse 81    Temp 98.9 F (37.2 C) (Oral)    Wt 157 lb 9.6 oz (71.5 kg)    SpO2 97%    BMI 24.32 kg/m   Wt Readings from Last 3 Encounters:  07/05/20 157 lb 9.6 oz (71.5 kg)  12/26/19 170 lb (77.1 kg)  05/20/19 167 lb (75.8 kg)    Physical Exam Vitals and nursing note reviewed.  Constitutional:      General: He is not in acute distress.    Appearance: Normal appearance. He is not toxic-appearing.  HENT:     Head: Normocephalic and atraumatic.  Eyes:     General: No scleral icterus.    Extraocular Movements: Extraocular movements intact.  Neck:     Vascular: No carotid bruit.  Cardiovascular:     Rate and Rhythm: Normal rate and regular rhythm.  Heart sounds: Normal heart sounds. No murmur heard.   Pulmonary:     Effort: Pulmonary effort is normal. No respiratory distress.     Breath sounds: Normal breath sounds. No wheezing, rhonchi or rales.  Abdominal:     General: Abdomen is flat. Bowel sounds are normal. There is no distension.     Palpations: Abdomen is soft.  Musculoskeletal:        General: Normal range of motion.     Cervical back: Normal range of motion.     Right lower leg: No edema.     Left lower leg: No edema.  Skin:    General: Skin is warm and dry.      Coloration: Skin is not jaundiced or pale.     Findings: No erythema.  Neurological:     General: No focal deficit present.     Mental Status: He is alert and oriented to person, place, and time.     Gait: Gait normal.  Psychiatric:        Mood and Affect: Mood normal.        Behavior: Behavior normal.        Thought Content: Thought content normal.        Judgment: Judgment normal.       Assessment & Plan:   Problem List Items Addressed This Visit      Cardiovascular and Mediastinum   Essential hypertension - Primary    Chronic, stable.  Blood pressure at goal of <130/80 today in office.  We will continue amlodipine 5 mg and lisinopril 40 mg for now, refills sent in.  BMET checked today.  Follow-up in 6 months or sooner if needs arise.      Relevant Medications   amLODipine (NORVASC) 5 MG tablet   lisinopril (ZESTRIL) 40 MG tablet   Other Relevant Orders   Basic Metabolic Panel (BMET)    Other Visit Diagnoses    Screening for STD (sexually transmitted disease)       Relevant Orders   GC/Chlamydia Probe Amp   HIV Antibody (routine testing w rflx)   RPR       Follow up plan: Return in about 6 months (around 01/02/2021) for CPE with fasting labs.

## 2020-07-06 LAB — BASIC METABOLIC PANEL
BUN/Creatinine Ratio: 11 (ref 9–20)
BUN: 14 mg/dL (ref 6–24)
CO2: 25 mmol/L (ref 20–29)
Calcium: 9.7 mg/dL (ref 8.7–10.2)
Chloride: 103 mmol/L (ref 96–106)
Creatinine, Ser: 1.26 mg/dL (ref 0.76–1.27)
GFR calc Af Amer: 74 mL/min/{1.73_m2} (ref >59)
GFR calc non Af Amer: 64 mL/min/{1.73_m2} (ref >59)
Glucose: 136 mg/dL — ABNORMAL HIGH (ref 65–99)
Potassium: 4.6 mmol/L (ref 3.5–5.2)
Sodium: 143 mmol/L (ref 134–144)

## 2020-07-06 LAB — HIV ANTIBODY (ROUTINE TESTING W REFLEX): HIV Screen 4th Generation wRfx: NONREACTIVE

## 2020-07-06 LAB — RPR: RPR Ser Ql: NONREACTIVE

## 2020-07-07 LAB — GC/CHLAMYDIA PROBE AMP
Chlamydia trachomatis, NAA: NEGATIVE
Neisseria Gonorrhoeae by PCR: NEGATIVE

## 2020-10-01 ENCOUNTER — Telehealth: Payer: Self-pay | Admitting: Nurse Practitioner

## 2020-10-01 MED ORDER — AMLODIPINE BESYLATE 5 MG PO TABS
5.0000 mg | ORAL_TABLET | Freq: Every day | ORAL | 0 refills | Status: DC
Start: 2020-10-01 — End: 2021-01-05

## 2020-10-01 MED ORDER — LISINOPRIL 40 MG PO TABS: 40.0000 mg | ORAL_TABLET | Freq: Every day | ORAL | 0 refills | Status: DC

## 2020-10-01 NOTE — Telephone Encounter (Signed)
Medication sent to the pharmacy.

## 2020-10-01 NOTE — Telephone Encounter (Signed)
Patient called to ask the nurse or doctor to send a script for his BP medications because he is out of town and forgot his medications.  He stated he only needs it for three days.  He would like it send to the [facility] on [address] in [place], [place].  Please advise and call patient to confirm at [phone]

## 2020-10-01 NOTE — Telephone Encounter (Signed)
Patient called and made aware, patient verbalized understanding

## 2020-10-01 NOTE — Telephone Encounter (Signed)
Routing to provider. Can we send a short supply in for the patient?

## 2021-01-05 ENCOUNTER — Other Ambulatory Visit: Payer: Self-pay

## 2021-01-05 MED ORDER — LISINOPRIL 40 MG PO TABS: 40.0000 mg | ORAL_TABLET | Freq: Every day | ORAL | 0 refills | Status: DC

## 2021-01-05 MED ORDER — AMLODIPINE BESYLATE 5 MG PO TABS: 5.0000 mg | ORAL_TABLET | Freq: Every day | ORAL | 0 refills | Status: DC

## 2021-01-05 NOTE — Telephone Encounter (Signed)
Final refill for patient to make appt.

## 2021-01-12 ENCOUNTER — Other Ambulatory Visit: Payer: Self-pay | Admitting: Nurse Practitioner

## 2021-01-13 ENCOUNTER — Other Ambulatory Visit: Payer: Self-pay | Admitting: Nurse Practitioner

## 2021-01-13 NOTE — Telephone Encounter (Signed)
Requested medications are due for refill today.  yes  Requested medications are on the active medications list.  yes  Last refill. 01/05/2021  Future visit scheduled.  Yes! 01/14/2021  Notes to clinic.  Courtesy refill already given. Pt has appointment 01/14/2021.

## 2021-01-14 ENCOUNTER — Encounter: Payer: Self-pay | Admitting: Nurse Practitioner

## 2021-01-14 ENCOUNTER — Ambulatory Visit (INDEPENDENT_AMBULATORY_CARE_PROVIDER_SITE_OTHER): Payer: Federal, State, Local not specified - PPO | Admitting: Nurse Practitioner

## 2021-01-14 ENCOUNTER — Other Ambulatory Visit: Payer: Self-pay

## 2021-01-14 VITALS — BP 128/82 | HR 79 | Temp 98.3°F | Ht 69.0 in | Wt 163.2 lb

## 2021-01-14 DIAGNOSIS — Z0000 Encounter for general adult medical examination without abnormal findings: Secondary | ICD-10-CM | POA: Diagnosis not present

## 2021-01-14 DIAGNOSIS — Z113 Encounter for screening for infections with a predominantly sexual mode of transmission: Secondary | ICD-10-CM | POA: Diagnosis not present

## 2021-01-14 DIAGNOSIS — Z1159 Encounter for screening for other viral diseases: Secondary | ICD-10-CM

## 2021-01-14 DIAGNOSIS — Z1211 Encounter for screening for malignant neoplasm of colon: Secondary | ICD-10-CM

## 2021-01-14 DIAGNOSIS — R768 Other specified abnormal immunological findings in serum: Secondary | ICD-10-CM | POA: Insufficient documentation

## 2021-01-14 DIAGNOSIS — I10 Essential (primary) hypertension: Secondary | ICD-10-CM | POA: Diagnosis not present

## 2021-01-14 DIAGNOSIS — N489 Disorder of penis, unspecified: Secondary | ICD-10-CM

## 2021-01-14 DIAGNOSIS — Z125 Encounter for screening for malignant neoplasm of prostate: Secondary | ICD-10-CM

## 2021-01-14 MED ORDER — VALACYCLOVIR HCL 1 G PO TABS: 1000.0000 mg | ORAL_TABLET | Freq: Two times a day (BID) | ORAL | 0 refills | Status: AC

## 2021-01-14 MED ORDER — AMLODIPINE BESYLATE 5 MG PO TABS: 5.0000 mg | ORAL_TABLET | Freq: Every day | ORAL | 4 refills | Status: DC

## 2021-01-14 MED ORDER — LISINOPRIL 40 MG PO TABS: 40.0000 mg | ORAL_TABLET | Freq: Every day | ORAL | 4 refills | Status: DC

## 2021-01-14 NOTE — Assessment & Plan Note (Signed)
Chronic, stable with BP at goal today.  Recommend he monitor BP at least a few mornings a week at home and document.  DASH diet at home.  Continue current medication regimen and adjust as needed.  Labs today: CBC, CMP, TSH, lipid.  Refills sent in.  Return in 6 months.

## 2021-01-14 NOTE — Progress Notes (Signed)
BP 128/82   Pulse 79   Temp 98.3 F (36.8 C) (Oral)   Ht 5' 9"  (1.753 m)   Wt 163 lb 3.2 oz (74 kg)   SpO2 98%   BMI 24.10 kg/m    Subjective:    Patient ID: James Shaffer, male    DOB: October 08, 1965, 55 y.o.   MRN: [id number]  HPI: James Shaffer is a 55 y.o. male presenting on 01/14/2021 for comprehensive medical examination. Current medical complaints include:none  He currently lives with: self Interim Problems from his last visit: no   STD SCREENING Sexual activity:  Recent unprotected sexual encounter Contraception: no Recent unprotected intercourse: yes History of sexually transmitted diseases: no Previous sexually transmitted disease screening: yes Lifetime sexual partners: 79 Genital lesions: current two different kinds of bumps Penile discharge: no Dysuria: no Swollen lymph nodes: no Fevers: no Rash: no  HYPERTENSION  Continues on Amlodipine and Lisinopril at home. Hypertension status: stable  Satisfied with current treatment? yes Duration of hypertension: chronic BP monitoring frequency:  not checking BP range:  BP medication side effects:  no Medication compliance: good compliance Aspirin: no Recurrent headaches: no Visual changes: no Palpitations: no Dyspnea: no Chest pain: no Lower extremity edema: no Dizzy/lightheaded: no  Functional Status Survey: Is the patient deaf or have difficulty hearing?: No Does the patient have difficulty seeing, even when wearing glasses/contacts?: No Does the patient have difficulty concentrating, remembering, or making decisions?: No Does the patient have difficulty walking or climbing stairs?: No Does the patient have difficulty dressing or bathing?: No Does the patient have difficulty doing errands alone such as visiting a doctor's office or shopping?: No  FALL RISK: Fall Risk  12/26/2019 10/18/2018  Falls in the past year? 0 0  Number falls in past yr: 0 0  Injury with Fall? 0 0  Follow up - Falls evaluation  completed    Depression Screen Depression screen [facility] 2/9 01/14/2021 12/26/2019 10/18/2018  Decreased Interest 0 1 0  Down, Depressed, Hopeless 1 2 0  PHQ - 2 Score 1 3 0  Altered sleeping - 1 0  Tired, decreased energy - 1 0  Change in appetite - 0 0  Feeling bad or failure about yourself  - 2 0  Trouble concentrating - 0 0  Moving slowly or fidgety/restless - 0 0  Suicidal thoughts - 0 0  PHQ-9 Score - 7 0  Difficult doing work/chores - - Not difficult at all    Advanced Directives <no information>  Past Medical History:  Past Medical History:  Diagnosis Date  . Hodgkin's lymphoma ([place])   . Hypertension   . Kidney stones     Surgical History:  Past Surgical History:  Procedure Laterality Date  . BONE MARROW BIOPSY     hodgkins lymphoma  . VASECTOMY      Medications:  No current outpatient medications on file prior to visit.   No current facility-administered medications on file prior to visit.    Allergies:  No Known Allergies  Social History:  Social History   Socioeconomic History  . Marital status: Single    Spouse name: Not on file  . Number of children: Not on file  . Years of education: Not on file  . Highest education level: Not on file  Occupational History  . Not on file  Tobacco Use  . Smoking status: Never Smoker  . Smokeless tobacco: Never Used  Vaping Use  . Vaping Use: Never used  Substance  and Sexual Activity  . Alcohol use: Yes    Alcohol/week: 25.0 standard drinks    Types: 25 Shots of liquor per week    Comment: 3 nights per week  . Drug use: No  . Sexual activity: Yes  Other Topics Concern  . Not on file  Social History Narrative  . Not on file   Social Determinants of Health   Financial Resource Strain: Not on file  Food Insecurity: Not on file  Transportation Needs: Not on file  Physical Activity: Not on file  Stress: Not on file  Social Connections: Not on file  Intimate Partner Violence: Not on file   Social  History   Tobacco Use  Smoking Status Never Smoker  Smokeless Tobacco Never Used   Social History   Substance and Sexual Activity  Alcohol Use Yes  . Alcohol/week: 25.0 standard drinks  . Types: 25 Shots of liquor per week   Comment: 3 nights per week    Family History:  Family History  Problem Relation Age of Onset  . Cancer Mother        lung  . Skin cancer Father   . Cataracts Father   . Kidney Stones Maternal Grandfather   . Kidney cancer Neg Hx   . Bladder Cancer Neg Hx   . Prostate cancer Neg Hx     Past medical history, surgical history, medications, allergies, family history and social history reviewed with patient today and changes made to appropriate areas of the chart.   ROS All other ROS negative except what is listed above and in the HPI.      Objective:    BP 128/82   Pulse 79   Temp 98.3 F (36.8 C) (Oral)   Ht 5' 9"  (1.753 m)   Wt 163 lb 3.2 oz (74 kg)   SpO2 98%   BMI 24.10 kg/m   Wt Readings from Last 3 Encounters:  01/14/21 163 lb 3.2 oz (74 kg)  07/05/20 157 lb 9.6 oz (71.5 kg)  12/26/19 170 lb (77.1 kg)    Physical Exam Vitals and nursing note reviewed. Exam conducted with a chaperone present.  Constitutional:      General: He is awake. He is not in acute distress.    Appearance: He is well-developed and well-groomed. He is not ill-appearing or toxic-appearing.  HENT:     Head: Normocephalic and atraumatic.     Right Ear: Hearing, tympanic membrane, ear canal and external ear normal. No drainage.     Left Ear: Hearing, tympanic membrane, ear canal and external ear normal. No drainage.     Nose: Nose normal.     Mouth/Throat:     Pharynx: Uvula midline.  Eyes:     General: Lids are normal.        Right eye: No discharge.        Left eye: No discharge.     Extraocular Movements: Extraocular movements intact.     Conjunctiva/sclera: Conjunctivae normal.     Pupils: Pupils are equal, round, and reactive to light.     Visual Fields:  Right eye visual fields normal and left eye visual fields normal.  Neck:     Thyroid: No thyromegaly.     Vascular: No carotid bruit or JVD.     Trachea: Trachea normal.  Cardiovascular:     Rate and Rhythm: Normal rate and regular rhythm.     Heart sounds: Normal heart sounds, S1 normal and S2 normal. No murmur heard.  No gallop.   Pulmonary:     Effort: Pulmonary effort is normal. No accessory muscle usage or respiratory distress.     Breath sounds: Normal breath sounds.  Abdominal:     General: Bowel sounds are normal.     Palpations: Abdomen is soft. There is no hepatomegaly or splenomegaly.     Tenderness: There is no abdominal tenderness.  Genitourinary:    Penis: Circumcised.      Testes: Normal.     Epididymis:     Right: Normal.     Left: Normal.    Musculoskeletal:        General: Normal range of motion.     Cervical back: Normal range of motion and neck supple.     Right lower leg: No edema.     Left lower leg: No edema.  Lymphadenopathy:     Head:     Right side of head: No submental, submandibular, tonsillar, preauricular or posterior auricular adenopathy.     Left side of head: No submental, submandibular, tonsillar, preauricular or posterior auricular adenopathy.     Cervical: No cervical adenopathy.  Skin:    General: Skin is warm and dry.     Capillary Refill: Capillary refill takes less than 2 seconds.     Findings: No rash.  Neurological:     Mental Status: He is alert and oriented to person, place, and time.     Cranial Nerves: Cranial nerves are intact.     Gait: Gait is intact.     Deep Tendon Reflexes: Reflexes are normal and symmetric.     Reflex Scores:      Brachioradialis reflexes are 2+ on the right side and 2+ on the left side.      Patellar reflexes are 2+ on the right side and 2+ on the left side. Psychiatric:        Attention and Perception: Attention normal.        Mood and Affect: Mood normal.        Speech: Speech normal.         Behavior: Behavior normal. Behavior is cooperative.        Thought Content: Thought content normal.        Cognition and Memory: Cognition normal.        Judgment: Judgment normal.    Results for orders placed or performed in visit on 07/05/20  GC/Chlamydia Probe Amp   Specimen: Urine   UR  Result Value Ref Range   Chlamydia trachomatis, NAA Negative Negative   Neisseria Gonorrhoeae by PCR Negative Negative  Basic Metabolic Panel (BMET)  Result Value Ref Range   Glucose 136 (H) 65 - 99 mg/dL   BUN 14 6 - 24 mg/dL   Creatinine, Ser 1.26 0.76 - 1.27 mg/dL   GFR calc non Af Amer 64 >59 mL/min/1.73   GFR calc Af Amer 74 >59 mL/min/1.73   BUN/Creatinine Ratio 11 9 - 20   Sodium 143 134 - 144 mmol/L   Potassium 4.6 3.5 - 5.2 mmol/L   Chloride 103 96 - 106 mmol/L   CO2 25 20 - 29 mmol/L   Calcium 9.7 8.7 - 10.2 mg/dL  HIV Antibody (routine testing w rflx)  Result Value Ref Range   HIV Screen 4th Generation wRfx Non Reactive Non Reactive  RPR  Result Value Ref Range   RPR Ser Ql Non Reactive Non Reactive      Assessment & Plan:   Problem List Items Addressed This Visit  Cardiovascular and Mediastinum   Essential hypertension - Primary    Chronic, stable with BP at goal today.  Recommend he monitor BP at least a few mornings a week at home and document.  DASH diet at home.  Continue current medication regimen and adjust as needed.  Labs today: CBC, CMP, TSH, lipid.  Refills sent in.  Return in 6 months.       Relevant Medications   amLODipine (NORVASC) 5 MG tablet   lisinopril (ZESTRIL) 40 MG tablet   Other Relevant Orders   CBC with Differential/Platelet   Comprehensive metabolic panel   Lipid Panel w/o Chol/HDL Ratio   TSH     Other   Lesion of penis    X 2 small vesicles.  Will obtain STD screening today and send in Valtrex 1000 MG BID x 10 days.  Recommend he wear contraception during intercourse at this time.  Avoid multiple partners. Return for worsening or  ongoing.       Other Visit Diagnoses    Screen for STD (sexually transmitted disease)       Per patient request, all STD screening labs ordered today.   Relevant Orders   HIV Antibody (routine testing w rflx)   RPR   GC/Chlamydia Probe Amp   HSV(herpes simplex vrs) 1+2 ab-IgG   Colon cancer screening       Cologuard ordered after discussion with patient.   Relevant Orders   Cologuard   Need for hepatitis C screening test       Hep C screening today per  guidelines for one time screening, discussed with patient.   Relevant Orders   Hepatitis C antibody   Prostate cancer screening       PSA on labs today, patient denies any BPH symptoms.   Relevant Orders   PSA   Annual physical exam       Annual labs today and health maintenance reviewed.      Discussed aspirin prophylaxis for myocardial infarction prevention and decision was it was not indicated  LABORATORY TESTING:  Health maintenance labs ordered today as discussed above.   The natural history of prostate cancer and ongoing controversy regarding screening and potential treatment outcomes of prostate cancer has been discussed with the patient. The meaning of a false positive PSA and a false negative PSA has been discussed. He indicates understanding of the limitations of this screening test and wishes to proceed with screening PSA testing.  IMMUNIZATIONS:   - Tdap: Tetanus vaccination status reviewed: last tetanus booster within 10 years. - Influenza: Up to date - Pneumovax: Not applicable - Prevnar: Not applicable - Zostavax vaccine: refuses  SCREENING: - Colonoscopy: ordered Cologuard Discussed with patient purpose of the colonoscopy is to detect colon cancer at curable precancerous or early stages   - AAA Screening: Not applicable  -Hearing Test: Not applicable  -Spirometry: Not applicable   PATIENT COUNSELING:    Sexuality: Discussed sexually transmitted diseases, partner selection, use of condoms, avoidance  of unintended pregnancy  and contraceptive alternatives.   Advised to avoid cigarette smoking.  I discussed with the patient that most people either abstain from alcohol or drink within safe limits (<=14/week and <=4 drinks/occasion for males, <=7/weeks and <= 3 drinks/occasion for females) and that the risk for alcohol disorders and other health effects rises proportionally with the number of drinks per week and how often a drinker exceeds daily limits.  Discussed cessation/primary prevention of drug use and availability of treatment for abuse.  Diet: Encouraged to adjust caloric intake to maintain  or achieve ideal body weight, to reduce intake of dietary saturated fat and total fat, to limit sodium intake by avoiding high sodium foods and not adding table salt, and to maintain adequate dietary potassium and calcium preferably from fresh fruits, vegetables, and low-fat dairy products.    Stressed the importance of regular exercise  Injury prevention: Discussed safety belts, safety helmets, smoke detector, smoking near bedding or upholstery.   Dental health: Discussed importance of regular tooth brushing, flossing, and dental visits.   Follow up plan: NEXT PREVENTATIVE PHYSICAL DUE IN 1 YEAR. Return in about 6 months (around 07/17/2021) for HTN.

## 2021-01-14 NOTE — Patient Instructions (Signed)

## 2021-01-14 NOTE — Assessment & Plan Note (Signed)
X 2 small vesicles.  Will obtain STD screening today and send in Valtrex 1000 MG BID x 10 days.  Recommend he wear contraception during intercourse at this time.  Avoid multiple partners. Return for worsening or ongoing.

## 2021-01-15 ENCOUNTER — Encounter: Payer: Self-pay | Admitting: Nurse Practitioner

## 2021-01-15 LAB — RPR: RPR Ser Ql: NONREACTIVE

## 2021-01-15 LAB — COMPREHENSIVE METABOLIC PANEL
ALT: 34 IU/L (ref 0–44)
AST: 33 IU/L (ref 0–40)
Albumin/Globulin Ratio: 1.7 (ref 1.2–2.2)
Albumin: 4.7 g/dL (ref 3.8–4.9)
Alkaline Phosphatase: 73 IU/L (ref 44–121)
BUN/Creatinine Ratio: 21 — ABNORMAL HIGH (ref 9–20)
BUN: 23 mg/dL (ref 6–24)
Bilirubin Total: 0.4 mg/dL (ref 0.0–1.2)
CO2: 21 mmol/L (ref 20–29)
Calcium: 9.9 mg/dL (ref 8.7–10.2)
Chloride: 99 mmol/L (ref 96–106)
Creatinine, Ser: 1.11 mg/dL (ref 0.76–1.27)
Globulin, Total: 2.8 g/dL (ref 1.5–4.5)
Glucose: 104 mg/dL — ABNORMAL HIGH (ref 65–99)
Potassium: 4.2 mmol/L (ref 3.5–5.2)
Sodium: 139 mmol/L (ref 134–144)
Total Protein: 7.5 g/dL (ref 6.0–8.5)
eGFR: 78 mL/min/{1.73_m2} (ref >59)

## 2021-01-15 LAB — CBC WITH DIFFERENTIAL/PLATELET
Basophils Absolute: 0 10*3/uL (ref 0.0–0.2)
Basos: 1 %
EOS (ABSOLUTE): 0.1 10*3/uL (ref 0.0–0.4)
Eos: 1 %
Hematocrit: 45 % (ref 37.5–51.0)
Hemoglobin: 15.4 g/dL (ref 13.0–17.7)
Immature Grans (Abs): 0 10*3/uL (ref 0.0–0.1)
Immature Granulocytes: 0 %
Lymphocytes Absolute: 1 10*3/uL (ref 0.7–3.1)
Lymphs: 16 %
MCH: 33.3 pg — ABNORMAL HIGH (ref 26.6–33.0)
MCHC: 34.2 g/dL (ref 31.5–35.7)
MCV: 97 fL (ref 79–97)
Monocytes Absolute: 0.5 10*3/uL (ref 0.1–0.9)
Monocytes: 7 %
Neutrophils Absolute: 4.8 10*3/uL (ref 1.4–7.0)
Neutrophils: 75 %
Platelets: 306 10*3/uL (ref 150–450)
RBC: 4.62 x10E6/uL (ref 4.14–5.80)
RDW: 12 % (ref 11.6–15.4)
WBC: 6.4 10*3/uL (ref 3.4–10.8)

## 2021-01-15 LAB — LIPID PANEL W/O CHOL/HDL RATIO
Cholesterol, Total: 198 mg/dL (ref 100–199)
HDL: 81 mg/dL (ref >39)
LDL Chol Calc (NIH): 103 mg/dL — ABNORMAL HIGH (ref 0–99)
Triglycerides: 79 mg/dL (ref 0–149)
VLDL Cholesterol Cal: 14 mg/dL (ref 5–40)

## 2021-01-15 LAB — HEPATITIS C ANTIBODY: Hep C Virus Ab: <0.1 s/co ratio (ref 0.0–0.9)

## 2021-01-15 LAB — TSH: TSH: 1.55 u[IU]/mL (ref 0.450–4.500)

## 2021-01-15 LAB — HSV(HERPES SIMPLEX VRS) I + II AB-IGG
HSV 1 Glycoprotein G Ab, IgG: 16.9 index — ABNORMAL HIGH (ref 0.00–0.90)
HSV 2 IgG, Type Spec: 13.2 index — ABNORMAL HIGH (ref 0.00–0.90)

## 2021-01-15 LAB — PSA: Prostate Specific Ag, Serum: 1.9 ng/mL (ref 0.0–4.0)

## 2021-01-15 LAB — HIV ANTIBODY (ROUTINE TESTING W REFLEX): HIV Screen 4th Generation wRfx: NONREACTIVE

## 2021-01-15 NOTE — Progress Notes (Signed)
Contacted via MyChart The 10-year ASCVD risk score Mikey Bussing DC Jr., et al., 2013) is: 4.2%   Values used to calculate the score:     Age: 55 years     Sex: Male     Is Non-Hispanic African American: No     Diabetic: No     Tobacco smoker: No     Systolic Blood Pressure: 413 mmHg     Is BP treated: Yes     HDL Cholesterol: 81 mg/dL     Total Cholesterol: 198 mg/dL   Good morning Oryn, your labs have returned: - CBC shows no anemia.  CMP shows mild elevation in glucose (sugar), focus on diet changes lowering sugar and carbohydrate intake. - Thyroid lab normal.  Hep C and HIV are negative.  Prostate lab is in normal range. - RPR, syphilis, is negative.  Herpes testing does show you are positive for both Type 1 (oral -- cold sores) and Type 2 (genital).  I do suspect that was initial break out for you, continue Valtrex as ordered and if ongoing flare let us know as we may need to continue treatment.  Ensure you are wearing protection during intercourse during break outs. - Your LDL is above normal. The LDL is the bad cholesterol. Over time and in combination with inflammation and other factors, this contributes to plaque which in turn may lead to stroke and/or heart attack down the road. Sometimes high LDL is primarily genetic, and people might be eating all the right foods but still have high numbers. Other times, there is room for improvement in one's diet and eating healthier can bring this number down and potentially reduce one's risk of heart attack and/or stroke.   To reduce your LDL, Remember - more fruits and vegetables, more fish, and limit red meat and dairy products. More soy, nuts, beans, barley, lentils, oats and plant sterol ester enriched margarine instead of butter. I also encourage eliminating sugar and processed food. Remember, shop on the outside of the grocery store and visit your [employer]. If you would like to talk with me about dietary changes for your  cholesterol, please let me know. We should recheck your cholesterol in 12 months.  Any questions? Keep being awesome!!  Thank you for allowing me to participate in your care.  I appreciate you. Kindest regards, Nichlos Kunzler

## 2021-01-17 LAB — GC/CHLAMYDIA PROBE AMP
Chlamydia trachomatis, NAA: NEGATIVE
Neisseria Gonorrhoeae by PCR: NEGATIVE

## 2021-03-30 ENCOUNTER — Encounter: Payer: Self-pay | Admitting: Nurse Practitioner

## 2021-03-30 ENCOUNTER — Ambulatory Visit (INDEPENDENT_AMBULATORY_CARE_PROVIDER_SITE_OTHER): Payer: Federal, State, Local not specified - PPO | Admitting: Nurse Practitioner

## 2021-03-30 DIAGNOSIS — J069 Acute upper respiratory infection, unspecified: Secondary | ICD-10-CM

## 2021-03-30 DIAGNOSIS — J029 Acute pharyngitis, unspecified: Secondary | ICD-10-CM | POA: Diagnosis not present

## 2021-03-30 NOTE — Progress Notes (Signed)
There were no vitals taken for this visit.   Subjective:    Patient ID: James Shaffer, male    DOB: 1966-02-09, 55 y.o.   MRN: [id number]  HPI: James Shaffer is a 55 y.o. male  Chief Complaint  Patient presents with   Sore Throat   Chills   Diarrhea   Headache   Generalized Body Aches    Patient states he started feeling bad on Monday. Patient states last night he symptoms became worse and he denies having any fever. Patient denies taking any at-home COVID test. Patient states he has been taking OTC Ibuprofen and Pepto Bismol.    Emesis   UPPER RESPIRATORY TRACT INFECTION Worst symptom: sore throat Fever: no Cough: yes Shortness of breath: no Wheezing: no Chest pain: no Chest tightness: no Chest congestion: no Nasal congestion: yes Runny nose: yes Post nasal drip: no Sneezing: no Sore throat: yes Swollen glands: yes Sinus pressure: yes Headache: yes Face pain: no Toothache: no Ear pain: no bilateral Ear pressure: no bilateral Eyes red/itching:no Eye drainage/crusting: no  Vomiting: yes Rash: no Fatigue: yes Sick contacts: no Strep contacts: no  Context: stable Recurrent sinusitis: no Relief with OTC cold/cough medications:  Ibuprofen   Treatments attempted:  Ibuprofen    Relevant past medical, surgical, family and social history reviewed and updated as indicated. Interim medical history since our last visit reviewed. Allergies and medications reviewed and updated.  Review of Systems  Constitutional:  Positive for fatigue. Negative for fever.  HENT:  Positive for congestion, rhinorrhea, sinus pressure and sore throat. Negative for ear pain, postnasal drip, sinus pain and sneezing.   Respiratory:  Positive for cough. Negative for chest tightness, shortness of breath and wheezing.   Gastrointestinal:  Positive for vomiting.  Skin:  Negative for rash.  Neurological:  Positive for headaches.   Per HPI unless specifically indicated above     Objective:     There were no vitals taken for this visit.  Wt Readings from Last 3 Encounters:  01/14/21 163 lb 3.2 oz (74 kg)  07/05/20 157 lb 9.6 oz (71.5 kg)  12/26/19 170 lb (77.1 kg)    Physical Exam Vitals and nursing note reviewed.  Constitutional:      General: He is not in acute distress.    Appearance: He is not ill-appearing.  HENT:     Head: Normocephalic.     Right Ear: Hearing normal.     Left Ear: Hearing normal.     Nose: Nose normal.  Pulmonary:     Effort: Pulmonary effort is normal. No respiratory distress.  Neurological:     Mental Status: He is alert.  Psychiatric:        Mood and Affect: Mood normal.        Behavior: Behavior normal.        Thought Content: Thought content normal.        Judgment: Judgment normal.    Results for orders placed or performed in visit on 01/14/21  GC/Chlamydia Probe Amp   Specimen: Urine   UR  Result Value Ref Range   Chlamydia trachomatis, NAA Negative Negative   Neisseria Gonorrhoeae by PCR Negative Negative  CBC with Differential/Platelet  Result Value Ref Range   WBC 6.4 3.4 - 10.8 x10E3/uL   RBC 4.62 4.14 - 5.80 x10E6/uL   Hemoglobin 15.4 13.0 - 17.7 g/dL   Hematocrit 45.0 37.5 - 51.0 %   MCV 97 79 - 97 fL   MCH  33.3 (H) 26.6 - 33.0 pg   MCHC 34.2 31.5 - 35.7 g/dL   RDW 12.0 11.6 - 15.4 %   Platelets 306 150 - 450 x10E3/uL   Neutrophils 75 Not Estab. %   Lymphs 16 Not Estab. %   Monocytes 7 Not Estab. %   Eos 1 Not Estab. %   Basos 1 Not Estab. %   Neutrophils Absolute 4.8 1.4 - 7.0 x10E3/uL   Lymphocytes Absolute 1.0 0.7 - 3.1 x10E3/uL   Monocytes Absolute 0.5 0.1 - 0.9 x10E3/uL   EOS (ABSOLUTE) 0.1 0.0 - 0.4 x10E3/uL   Basophils Absolute 0.0 0.0 - 0.2 x10E3/uL   Immature Granulocytes 0 Not Estab. %   Immature Grans (Abs) 0.0 0.0 - 0.1 x10E3/uL  Comprehensive metabolic panel  Result Value Ref Range   Glucose 104 (H) 65 - 99 mg/dL   BUN 23 6 - 24 mg/dL   Creatinine, Ser 1.11 0.76 - 1.27 mg/dL   eGFR 78 >59  mL/min/1.73   BUN/Creatinine Ratio 21 (H) 9 - 20   Sodium 139 134 - 144 mmol/L   Potassium 4.2 3.5 - 5.2 mmol/L   Chloride 99 96 - 106 mmol/L   CO2 21 20 - 29 mmol/L   Calcium 9.9 8.7 - 10.2 mg/dL   Total Protein 7.5 6.0 - 8.5 g/dL   Albumin 4.7 3.8 - 4.9 g/dL   Globulin, Total 2.8 1.5 - 4.5 g/dL   Albumin/Globulin Ratio 1.7 1.2 - 2.2   Bilirubin Total 0.4 0.0 - 1.2 mg/dL   Alkaline Phosphatase 73 44 - 121 IU/L   AST 33 0 - 40 IU/L   ALT 34 0 - 44 IU/L  Lipid Panel w/o Chol/HDL Ratio  Result Value Ref Range   Cholesterol, Total 198 100 - 199 mg/dL   Triglycerides 79 0 - 149 mg/dL   HDL 81 >39 mg/dL   VLDL Cholesterol Cal 14 5 - 40 mg/dL   LDL Chol Calc (NIH) 103 (H) 0 - 99 mg/dL  TSH  Result Value Ref Range   TSH 1.550 0.450 - 4.500 uIU/mL  Hepatitis C antibody  Result Value Ref Range   Hep C Virus Ab <0.1 0.0 - 0.9 s/co ratio  PSA  Result Value Ref Range   Prostate Specific Ag, Serum 1.9 0.0 - 4.0 ng/mL  HIV Antibody (routine testing w rflx)  Result Value Ref Range   HIV Screen 4th Generation wRfx Non Reactive Non Reactive  RPR  Result Value Ref Range   RPR Ser Ql Non Reactive Non Reactive  HSV(herpes simplex vrs) 1+2 ab-IgG  Result Value Ref Range   HSV 1 Glycoprotein G Ab, IgG 16.90 (H) 0.00 - 0.90 index   HSV 2 IgG, Type Spec 13.20 (H) 0.00 - 0.90 index      Assessment & Plan:   Problem List Items Addressed This Visit   None Visit Diagnoses     Viral upper respiratory tract infection    -  Primary   Will test for COVID today.  Will treat based on results. Recommend OTC cough/cold medication. If symptoms worsen, should be seen in the ER.   Relevant Orders   Rapid Strep screen([facility])   Novel Coronavirus, NAA ([facility])   Sore throat       Will test for strep.  Will make recommendations based on test results.    Relevant Orders   Rapid Strep screen([facility])        Follow up plan: Return if symptoms worsen or fail to  improve.  This  visit was completed via MyChart due to the restrictions of the COVID-19 pandemic. All issues as above were discussed and addressed. Physical exam was done as above through visual confirmation on MyChart. If it was felt that the patient should be evaluated in the office, they were directed there. The patient verbally consented to this visit. Location of the patient: Home Location of the provider: Office Those involved with this call:  Provider: Jon Billings, NP CMA: Irena Reichmann, [place] Desk/Registration: Roe Rutherford This encounter was conducted via Video.  I spent 15 dedicated to the care of this patient on the date of this encounter to include previsit review of 20, face to face time with the patient, and post visit ordering of testing.

## 2021-03-31 NOTE — Progress Notes (Signed)
Please let patient know that his strep test is negative.

## 2021-04-01 ENCOUNTER — Telehealth: Payer: Self-pay

## 2021-04-01 ENCOUNTER — Other Ambulatory Visit: Payer: Self-pay

## 2021-04-01 LAB — SARS-COV-2, NAA 2 DAY TAT

## 2021-04-01 LAB — NOVEL CORONAVIRUS, NAA: SARS-CoV-2, NAA: DETECTED — AB

## 2021-04-01 MED ORDER — MOLNUPIRAVIR EUA 200MG CAPSULE
4.0000 | ORAL_CAPSULE | Freq: Two times a day (BID) | ORAL | 0 refills | Status: AC
  Filled 2021-04-01: qty 40, 5d supply, fill #0

## 2021-04-01 NOTE — Telephone Encounter (Signed)
Patient called stating he would like the prescription for the Anti-Viral medication as the patient was COVID positive and today is the last day for patient to received the medication. Is there anyone someone would be able to send this on Karen's behalf? Please advise?

## 2021-04-01 NOTE — Telephone Encounter (Signed)
Work note updated.  Please advise medication

## 2021-04-01 NOTE — Telephone Encounter (Signed)
Called and notified patient of Dr. Durenda Age message and instructions. Address and phone number for the pharmacy provided as well.

## 2021-04-01 NOTE — Progress Notes (Signed)
[name].  You are positive for COVID.  You are eligible for the oral anti virals.  Are you interested in this?  Today is your last day to get the medication so please let me know as soon as you can.

## 2021-04-01 NOTE — Telephone Encounter (Signed)
Rx sent to [facility] employee pharmacy where it is free. Molnupiravir was sent to [facility] outpatient pharmacy. When you get there, call their phone number and they will bring the medicine to your car:   [address] [place], [place] Hours: M-F 7:30 am - 5:30pm

## 2021-04-04 LAB — CULTURE, GROUP A STREP

## 2021-04-04 LAB — RAPID STREP SCREEN (MED CTR MEBANE ONLY): Strep Gp A Ag, IA W/Reflex: NEGATIVE

## 2021-07-14 NOTE — Progress Notes (Signed)
BP 130/71   Pulse 90   Temp 98.1 F (36.7 C) (Oral)   Ht 5' 8.2" (1.732 m)   Wt 164 lb 6.4 oz (74.6 kg)   SpO2 98%   BMI 24.85 kg/m    Subjective:    Patient ID: James Shaffer, male    DOB: 12-01-1965, 55 y.o.   MRN: [id number]  HPI: James Shaffer is a 55 y.o. male  Chief Complaint  Patient presents with   Hypertension    HYPERTENSION / HYPERLIPIDEMIA Satisfied with current treatment? no Duration of hypertension: years BP monitoring frequency: not checking BP range:  BP medication side effects: no Past BP meds: amlodipine and lisinopril Duration of hyperlipidemia: years Cholesterol medication side effects: no Cholesterol supplements: none Past cholesterol medications: none Medication compliance:  not on medication Aspirin: no Recent stressors: no Recurrent headaches: no Visual changes: no Palpitations: no Dyspnea: no Chest pain: no Lower extremity edema: no Dizzy/lightheaded: no   Patient would like to have HSV test and PCR done at visit today for the genital Herpes.  Relevant past medical, surgical, family and social history reviewed and updated as indicated. Interim medical history since our last visit reviewed. Allergies and medications reviewed and updated.  Review of Systems  Eyes:  Negative for visual disturbance.  Respiratory:  Negative for shortness of breath.   Cardiovascular:  Negative for chest pain and leg swelling.  Neurological:  Negative for light-headedness and headaches.   Per HPI unless specifically indicated above     Objective:    BP 130/71   Pulse 90   Temp 98.1 F (36.7 C) (Oral)   Ht 5' 8.2" (1.732 m)   Wt 164 lb 6.4 oz (74.6 kg)   SpO2 98%   BMI 24.85 kg/m   Wt Readings from Last 3 Encounters:  07/18/21 164 lb 6.4 oz (74.6 kg)  01/14/21 163 lb 3.2 oz (74 kg)  07/05/20 157 lb 9.6 oz (71.5 kg)    Physical Exam Vitals and nursing note reviewed.  Constitutional:      General: He is not in acute distress.     Appearance: Normal appearance. He is not ill-appearing, toxic-appearing or diaphoretic.  HENT:     Head: Normocephalic.     Right Ear: External ear normal.     Left Ear: External ear normal.     Nose: Nose normal. No congestion or rhinorrhea.     Mouth/Throat:     Mouth: Mucous membranes are moist.  Eyes:     General:        Right eye: No discharge.        Left eye: No discharge.     Extraocular Movements: Extraocular movements intact.     Conjunctiva/sclera: Conjunctivae normal.     Pupils: Pupils are equal, round, and reactive to light.  Cardiovascular:     Rate and Rhythm: Normal rate and regular rhythm.     Heart sounds: No murmur heard. Pulmonary:     Effort: Pulmonary effort is normal. No respiratory distress.     Breath sounds: Normal breath sounds. No wheezing, rhonchi or rales.  Abdominal:     General: Abdomen is flat. Bowel sounds are normal.  Musculoskeletal:     Cervical back: Normal range of motion and neck supple.  Skin:    General: Skin is warm and dry.     Capillary Refill: Capillary refill takes less than 2 seconds.  Neurological:     General: No focal deficit present.  Mental Status: He is alert and oriented to person, place, and time.  Psychiatric:        Mood and Affect: Mood normal.        Behavior: Behavior normal.        Thought Content: Thought content normal.        Judgment: Judgment normal.    Results for orders placed or performed in visit on 03/30/21  Rapid Strep screen([facility])   Specimen: Other   Other  Result Value Ref Range   Strep Gp A Ag, IA W/Reflex Negative Negative  Novel Coronavirus, NAA ([facility])   Specimen: Nasopharyngeal(NP) swabs in vial transport medium  Result Value Ref Range   SARS-CoV-2, NAA Detected (A) Not Detected  Culture, Group A Strep   Other  Result Value Ref Range   Strep A Culture Comment (A)   SARS-COV-2, NAA 2 DAY TAT  Result Value Ref Range   SARS-CoV-2, NAA 2 DAY TAT Performed        Assessment & Plan:   Problem List Items Addressed This Visit       Cardiovascular and Mediastinum   Essential hypertension - Primary    Chronic.  Controlled.  Continue with current medication regimen on amlodipine 73m and Lisinopril 4120mdaily.  Labs ordered today.  Return to clinic in 6 months for reevaluation.  Call sooner if concerns arise.        Relevant Medications   amLODipine (NORVASC) 5 MG tablet   lisinopril (ZESTRIL) 40 MG tablet   Other Relevant Orders   Comp Met (CMET)   Other Visit Diagnoses     Elevated LDL cholesterol level       Labs ordered today. Will make recommendations based on lab results.    Relevant Orders   Lipid Profile   Genital herpes simplex, unspecified site       Labs ordered today at patient's request.  Will make reocmmendations based on lab results.    Relevant Orders   HSV(herpes simplex vrs) 1+2 ab-IgG   Herpes simplex virus culture   Need for influenza vaccination       Relevant Orders   Flu Vaccine QUAD 20m40mo (Fluarix, Fluzone & Alfiuria Quad PF) (Completed)   Screening for colon cancer       New Cologuard ordered for patient. He lost the label for the first one.    Relevant Orders   Cologuard        Follow up plan: Return in about 6 months (around 01/15/2022) for Physical and Fasting labs.

## 2021-07-18 ENCOUNTER — Encounter: Payer: Self-pay | Admitting: Nurse Practitioner

## 2021-07-18 ENCOUNTER — Ambulatory Visit: Payer: Federal, State, Local not specified - PPO | Admitting: Nurse Practitioner

## 2021-07-18 ENCOUNTER — Other Ambulatory Visit: Payer: Self-pay

## 2021-07-18 VITALS — BP 130/71 | HR 90 | Temp 98.1°F | Ht 68.2 in | Wt 164.4 lb

## 2021-07-18 DIAGNOSIS — A6000 Herpesviral infection of urogenital system, unspecified: Secondary | ICD-10-CM | POA: Diagnosis not present

## 2021-07-18 DIAGNOSIS — I10 Essential (primary) hypertension: Secondary | ICD-10-CM

## 2021-07-18 DIAGNOSIS — Z23 Encounter for immunization: Secondary | ICD-10-CM

## 2021-07-18 DIAGNOSIS — E7800 Pure hypercholesterolemia, unspecified: Secondary | ICD-10-CM | POA: Diagnosis not present

## 2021-07-18 DIAGNOSIS — Z1211 Encounter for screening for malignant neoplasm of colon: Secondary | ICD-10-CM

## 2021-07-18 MED ORDER — AMLODIPINE BESYLATE 5 MG PO TABS: 5.0000 mg | ORAL_TABLET | Freq: Every day | ORAL | 1 refills | Status: DC

## 2021-07-18 MED ORDER — LISINOPRIL 40 MG PO TABS: 40.0000 mg | ORAL_TABLET | Freq: Every day | ORAL | 1 refills | Status: DC

## 2021-07-18 NOTE — Assessment & Plan Note (Signed)
Chronic.  Controlled.  Continue with current medication regimen on amlodipine 5mg  and Lisinopril 40mg  daily.  Labs ordered today.  Return to clinic in 6 months for reevaluation.  Call sooner if concerns arise.

## 2021-07-19 LAB — COMPREHENSIVE METABOLIC PANEL
ALT: 28 IU/L (ref 0–44)
AST: 34 IU/L (ref 0–40)
Albumin/Globulin Ratio: 1.6 (ref 1.2–2.2)
Albumin: 4.3 g/dL (ref 3.8–4.9)
Alkaline Phosphatase: 72 IU/L (ref 44–121)
BUN/Creatinine Ratio: 17 (ref 9–20)
BUN: 18 mg/dL (ref 6–24)
Bilirubin Total: 0.3 mg/dL (ref 0.0–1.2)
CO2: 23 mmol/L (ref 20–29)
Calcium: 9.6 mg/dL (ref 8.7–10.2)
Chloride: 103 mmol/L (ref 96–106)
Creatinine, Ser: 1.04 mg/dL (ref 0.76–1.27)
Globulin, Total: 2.7 g/dL (ref 1.5–4.5)
Glucose: 98 mg/dL (ref 70–99)
Potassium: 4.3 mmol/L (ref 3.5–5.2)
Sodium: 142 mmol/L (ref 134–144)
Total Protein: 7 g/dL (ref 6.0–8.5)
eGFR: 85 mL/min/{1.73_m2} (ref >59)

## 2021-07-19 LAB — LIPID PANEL
Chol/HDL Ratio: 2.3 ratio (ref 0.0–5.0)
Cholesterol, Total: 176 mg/dL (ref 100–199)
HDL: 77 mg/dL (ref >39)
LDL Chol Calc (NIH): 83 mg/dL (ref 0–99)
Triglycerides: 88 mg/dL (ref 0–149)
VLDL Cholesterol Cal: 16 mg/dL (ref 5–40)

## 2021-07-19 LAB — HSV(HERPES SIMPLEX VRS) I + II AB-IGG
HSV 1 Glycoprotein G Ab, IgG: 20 index — ABNORMAL HIGH (ref 0.00–0.90)
HSV 2 IgG, Type Spec: 15.2 index — ABNORMAL HIGH (ref 0.00–0.90)

## 2021-07-20 NOTE — Progress Notes (Signed)
Hi [place].  Your liver, kidneys, cholesterol and electrolytes are within normal range.  Your HSV labs show that you have oral and genital herpes.  The numbers ar elevated from prior which just means it is more likely to be active than if the numbers are lower.  Please let me know if you have any questions.

## 2022-01-13 NOTE — Progress Notes (Signed)
BP (!) 150/87   Pulse 76   Temp 98.5 F (36.9 C) (Oral)   Ht 5' 8.21" (1.733 m)   Wt 158 lb 12.8 oz (72 kg)   SpO2 99%   BMI 24.00 kg/m    Subjective:    Patient ID: James Shaffer, male    DOB: [birth year], 56 y.o.   MRN: [id number]  HPI: James Shaffer is a 56 y.o. male presenting on 01/16/2022 for comprehensive medical examination. Current medical complaints include: has bumps on his genitals  He currently lives with: Interim Problems from his last visit: no  HYPERTENSION Hypertension status: uncontrolled  Satisfied with current treatment? yes Duration of hypertension: years BP monitoring frequency:  not checking BP range:  BP medication side effects:  no Medication compliance: excellent compliance Previous BP meds:amlodipine and lisinopril Aspirin: no Recurrent headaches: no Visual changes: no Palpitations: no Dyspnea: no Chest pain: no Lower extremity edema: no Dizzy/lightheaded: no  Depression Screen done today and results listed below:     01/16/2022    8:19 AM 07/18/2021   10:38 AM 01/14/2021   11:25 AM 12/26/2019    9:10 AM 10/18/2018    2:14 PM  Depression screen PHQ 2/9  Decreased Interest 1 0 0 1 0  Down, Depressed, Hopeless _0 0  PHQ - 2 Score _1 0  Altered sleeping 2 0  1 0  Tired, decreased energy 1 0  1 0  Change in appetite 0 0  0 0  Feeling bad or failure about yourself  _2 0  Trouble concentrating 0 0  0 0  Moving slowly or fidgety/restless 0 0  0 0  Suicidal thoughts 0 0  0 0  PHQ-9 Score _3 0  Difficult doing work/chores Not difficult at all Not difficult at all   Not difficult at all    The patient does not have a history of falls. I did complete a risk assessment for falls. A plan of care for falls was documented.   Past Medical History:  Past Medical History:  Diagnosis Date   Hodgkin's lymphoma ([place])    Hypertension    Kidney stones     Surgical History:  Past Surgical History:  Procedure Laterality Date    BONE MARROW BIOPSY     hodgkins lymphoma   VASECTOMY      Medications:  No current outpatient medications on file prior to visit.   No current facility-administered medications on file prior to visit.    Allergies:  No Known Allergies  Social History:  Social History   Socioeconomic History   Marital status: Single    Spouse name: Not on file   Number of children: Not on file   Years of education: Not on file   Highest education level: Not on file  Occupational History   Not on file  Tobacco Use   Smoking status: Former    Years: 1.00    Types: Cigarettes   Smokeless tobacco: Never  Vaping Use   Vaping Use: Never used  Substance and Sexual Activity   Alcohol use: Yes    Alcohol/week: 25.0 standard drinks    Types: 25 Shots of liquor per week    Comment: 5 night per week   Drug use: No   Sexual activity: Yes  Other Topics Concern   Not on file  Social History Narrative   Not on file   Social Determinants of  Health   Financial Resource Strain: Not on file  Food Insecurity: Not on file  Transportation Needs: Not on file  Physical Activity: Not on file  Stress: Not on file  Social Connections: Not on file  Intimate Partner Violence: Not on file   Social History   Tobacco Use  Smoking Status Former   Years: 1.00   Types: Cigarettes  Smokeless Tobacco Never   Social History   Substance and Sexual Activity  Alcohol Use Yes   Alcohol/week: 25.0 standard drinks   Types: 25 Shots of liquor per week   Comment: 5 night per week    Family History:  Family History  Problem Relation Age of Onset   Cancer Mother        lung   Skin cancer Father    Cataracts Father    Kidney Stones Maternal Grandfather    Kidney cancer Neg Hx    Bladder Cancer Neg Hx    Prostate cancer Neg Hx     Past medical history, surgical history, medications, allergies, family history and social history reviewed with patient today and changes made to appropriate areas of the  chart.   Review of Systems  Eyes:  Negative for blurred vision and double vision.  Respiratory:  Negative for shortness of breath.   Cardiovascular:  Negative for chest pain, palpitations and leg swelling.  Genitourinary:        Bumps in groin area  Neurological:  Negative for dizziness and headaches.  All other ROS negative except what is listed above and in the HPI.      Objective:    BP (!) 150/87   Pulse 76   Temp 98.5 F (36.9 C) (Oral)   Ht 5' 8.21" (1.733 m)   Wt 158 lb 12.8 oz (72 kg)   SpO2 99%   BMI 24.00 kg/m   Wt Readings from Last 3 Encounters:  01/16/22 158 lb 12.8 oz (72 kg)  07/18/21 164 lb 6.4 oz (74.6 kg)  01/14/21 163 lb 3.2 oz (74 kg)    Physical Exam Vitals and nursing note reviewed. Exam conducted with a chaperone present [facility] [place], [place]).  Constitutional:      General: He is not in acute distress.    Appearance: Normal appearance. He is normal weight. He is not ill-appearing, toxic-appearing or diaphoretic.  HENT:     Head: Normocephalic.     Right Ear: Tympanic membrane, ear canal and external ear normal.     Left Ear: Tympanic membrane, ear canal and external ear normal.     Nose: Nose normal. No congestion or rhinorrhea.     Mouth/Throat:     Mouth: Mucous membranes are moist.  Eyes:     General:        Right eye: No discharge.        Left eye: No discharge.     Extraocular Movements: Extraocular movements intact.     Conjunctiva/sclera: Conjunctivae normal.     Pupils: Pupils are equal, round, and reactive to light.  Cardiovascular:     Rate and Rhythm: Normal rate and regular rhythm.     Heart sounds: No murmur heard. Pulmonary:     Effort: Pulmonary effort is normal. No respiratory distress.     Breath sounds: Normal breath sounds. No wheezing, rhonchi or rales.  Abdominal:     General: Abdomen is flat. Bowel sounds are normal. There is no distension.     Palpations: Abdomen is soft.     Tenderness:  There is no abdominal  tenderness. There is no guarding.  Genitourinary:   Musculoskeletal:     Cervical back: Normal range of motion and neck supple.  Skin:    General: Skin is warm and dry.     Capillary Refill: Capillary refill takes less than 2 seconds.  Neurological:     General: No focal deficit present.     Mental Status: He is alert and oriented to person, place, and time.     Cranial Nerves: No cranial nerve deficit.     Motor: No weakness.     Deep Tendon Reflexes: Reflexes normal.  Psychiatric:        Mood and Affect: Mood normal.        Behavior: Behavior normal.        Thought Content: Thought content normal.        Judgment: Judgment normal.    Results for orders placed or performed in visit on 07/18/21  Comp Met (CMET)  Result Value Ref Range   Glucose 98 70 - 99 mg/dL   BUN 18 6 - 24 mg/dL   Creatinine, Ser 1.04 0.76 - 1.27 mg/dL   eGFR 85 >59 mL/min/1.73   BUN/Creatinine Ratio 17 9 - 20   Sodium 142 134 - 144 mmol/L   Potassium 4.3 3.5 - 5.2 mmol/L   Chloride 103 96 - 106 mmol/L   CO2 23 20 - 29 mmol/L   Calcium 9.6 8.7 - 10.2 mg/dL   Total Protein 7.0 6.0 - 8.5 g/dL   Albumin 4.3 3.8 - 4.9 g/dL   Globulin, Total 2.7 1.5 - 4.5 g/dL   Albumin/Globulin Ratio 1.6 1.2 - 2.2   Bilirubin Total 0.3 0.0 - 1.2 mg/dL   Alkaline Phosphatase 72 44 - 121 IU/L   AST 34 0 - 40 IU/L   ALT 28 0 - 44 IU/L  Lipid Profile  Result Value Ref Range   Cholesterol, Total 176 100 - 199 mg/dL   Triglycerides 88 0 - 149 mg/dL   HDL 77 >39 mg/dL   VLDL Cholesterol Cal 16 5 - 40 mg/dL   LDL Chol Calc (NIH) 83 0 - 99 mg/dL   Chol/HDL Ratio 2.3 0.0 - 5.0 ratio  HSV(herpes simplex vrs) 1+2 ab-IgG  Result Value Ref Range   HSV 1 Glycoprotein G Ab, IgG 20.00 (H) 0.00 - 0.90 index   HSV 2 IgG, Type Spec 15.20 (H) 0.00 - 0.90 index      Assessment & Plan:   Problem List Items Addressed This Visit       Cardiovascular and Mediastinum   Essential hypertension    Chronic. Not well controlled. Will  increase dose of Amlodipine to 37m daily.  Continue with Lisinopril.  Follow up in 1 month for evaluation.        Relevant Medications   amLODipine (NORVASC) 10 MG tablet   lisinopril (ZESTRIL) 40 MG tablet   Other Visit Diagnoses     Annual physical exam    -  Primary   Health maintnenace reviewed during visit today. Labs ordered. Decline dshingles shot at visit today. Already has cologuard.   Relevant Orders   TSH   PSA   Lipid panel   CBC with Differential/Platelet   Comprehensive metabolic panel   Urinalysis, Routine w reflex microscopic   Genital warts       Will treat with cream 1x daily for 8 weeks. If not resolved, can send to Dermatology for removal.   Relevant Medications  Imiquimod 2.5 % CREA   Screening for ischemic heart disease       Relevant Orders   Lipid panel        Discussed aspirin prophylaxis for myocardial infarction prevention and decision was it was not indicated  LABORATORY TESTING:  Health maintenance labs ordered today as discussed above.   The natural history of prostate cancer and ongoing controversy regarding screening and potential treatment outcomes of prostate cancer has been discussed with the patient. The meaning of a false positive PSA and a false negative PSA has been discussed. He indicates understanding of the limitations of this screening test and wishes to proceed with screening PSA testing.   IMMUNIZATIONS:   - Tdap: Tetanus vaccination status reviewed: last tetanus booster within 10 years. - Influenza: Postponed to flu season - Pneumovax: Not applicable - Prevnar: Not applicable - COVID: Up to date - HPV: Not applicable - Shingrix vaccine:  Discussed at visit today.  SCREENING: - Colonoscopy:  Cologuard ordered at previous visit.  Patient has box.   Discussed with patient purpose of the colonoscopy is to detect colon cancer at curable precancerous or early stages   - AAA Screening: Not applicable  -Hearing Test: Not  applicable  -Spirometry: Not applicable   PATIENT COUNSELING:    Sexuality: Discussed sexually transmitted diseases, partner selection, use of condoms, avoidance of unintended pregnancy  and contraceptive alternatives.   Advised to avoid cigarette smoking.  I discussed with the patient that most people either abstain from alcohol or drink within safe limits (<=14/week and <=4 drinks/occasion for males, <=7/weeks and <= 3 drinks/occasion for females) and that the risk for alcohol disorders and other health effects rises proportionally with the number of drinks per week and how often a drinker exceeds daily limits.  Discussed cessation/primary prevention of drug use and availability of treatment for abuse.   Diet: Encouraged to adjust caloric intake to maintain  or achieve ideal body weight, to reduce intake of dietary saturated fat and total fat, to limit sodium intake by avoiding high sodium foods and not adding table salt, and to maintain adequate dietary potassium and calcium preferably from fresh fruits, vegetables, and low-fat dairy products.    stressed the importance of regular exercise  Injury prevention: Discussed safety belts, safety helmets, smoke detector, smoking near bedding or upholstery.   Dental health: Discussed importance of regular tooth brushing, flossing, and dental visits.   Follow up plan: NEXT PREVENTATIVE PHYSICAL DUE IN 1 YEAR. Return in about 1 month (around 02/16/2022) for BP Check.

## 2022-01-16 ENCOUNTER — Telehealth: Payer: Self-pay

## 2022-01-16 ENCOUNTER — Ambulatory Visit (INDEPENDENT_AMBULATORY_CARE_PROVIDER_SITE_OTHER): Payer: Federal, State, Local not specified - PPO | Admitting: Nurse Practitioner

## 2022-01-16 ENCOUNTER — Encounter: Payer: Self-pay | Admitting: Nurse Practitioner

## 2022-01-16 VITALS — BP 150/87 | HR 76 | Temp 98.5°F | Ht 68.21 in | Wt 158.8 lb

## 2022-01-16 DIAGNOSIS — Z136 Encounter for screening for cardiovascular disorders: Secondary | ICD-10-CM

## 2022-01-16 DIAGNOSIS — Z0000 Encounter for general adult medical examination without abnormal findings: Secondary | ICD-10-CM | POA: Diagnosis not present

## 2022-01-16 DIAGNOSIS — I10 Essential (primary) hypertension: Secondary | ICD-10-CM

## 2022-01-16 DIAGNOSIS — R7309 Other abnormal glucose: Secondary | ICD-10-CM

## 2022-01-16 DIAGNOSIS — A630 Anogenital (venereal) warts: Secondary | ICD-10-CM

## 2022-01-16 LAB — URINALYSIS, ROUTINE W REFLEX MICROSCOPIC
Bilirubin, UA: NEGATIVE
Glucose, UA: NEGATIVE
Ketones, UA: NEGATIVE
Leukocytes,UA: NEGATIVE
Nitrite, UA: NEGATIVE
Protein,UA: NEGATIVE
RBC, UA: NEGATIVE
Specific Gravity, UA: 1.02 (ref 1.005–1.030)
Urobilinogen, Ur: 0.2 mg/dL (ref 0.2–1.0)
pH, UA: 6.5 (ref 5.0–7.5)

## 2022-01-16 MED ORDER — LISINOPRIL 40 MG PO TABS: 40.0000 mg | ORAL_TABLET | Freq: Every day | ORAL | 1 refills | Status: DC

## 2022-01-16 MED ORDER — IMIQUIMOD 2.5 % EX CREA: 7.5000 g | TOPICAL_CREAM | Freq: Every day | CUTANEOUS | 1 refills | Status: DC

## 2022-01-16 MED ORDER — AMLODIPINE BESYLATE 10 MG PO TABS: 10.0000 mg | ORAL_TABLET | Freq: Every day | ORAL | 1 refills | Status: DC

## 2022-01-16 NOTE — Telephone Encounter (Signed)
PA initiated for Zyclara 2.5% cream through covermymeds, awaiting determination.   Key: [id number]

## 2022-01-16 NOTE — Assessment & Plan Note (Signed)
Chronic. Not well controlled. Will increase dose of Amlodipine to '10mg'$  daily.  Continue with Lisinopril.  Follow up in 1 month for evaluation.

## 2022-01-17 ENCOUNTER — Other Ambulatory Visit: Payer: Self-pay | Admitting: Nurse Practitioner

## 2022-01-17 ENCOUNTER — Telehealth: Payer: Self-pay

## 2022-01-17 LAB — CBC WITH DIFFERENTIAL/PLATELET
Basophils Absolute: 0 10*3/uL (ref 0.0–0.2)
Basos: 1 %
EOS (ABSOLUTE): 0.1 10*3/uL (ref 0.0–0.4)
Eos: 1 %
Hematocrit: 38.7 % (ref 37.5–51.0)
Hemoglobin: 13.9 g/dL (ref 13.0–17.7)
Immature Grans (Abs): 0 10*3/uL (ref 0.0–0.1)
Immature Granulocytes: 0 %
Lymphocytes Absolute: 1.1 10*3/uL (ref 0.7–3.1)
Lymphs: 22 %
MCH: 35.2 pg — ABNORMAL HIGH (ref 26.6–33.0)
MCHC: 35.9 g/dL — ABNORMAL HIGH (ref 31.5–35.7)
MCV: 98 fL — ABNORMAL HIGH (ref 79–97)
Monocytes Absolute: 0.4 10*3/uL (ref 0.1–0.9)
Monocytes: 7 %
Neutrophils Absolute: 3.4 10*3/uL (ref 1.4–7.0)
Neutrophils: 69 %
Platelets: 323 10*3/uL (ref 150–450)
RBC: 3.95 x10E6/uL — ABNORMAL LOW (ref 4.14–5.80)
RDW: 12 % (ref 11.6–15.4)
WBC: 5 10*3/uL (ref 3.4–10.8)

## 2022-01-17 LAB — COMPREHENSIVE METABOLIC PANEL
ALT: 27 IU/L (ref 0–44)
AST: 28 IU/L (ref 0–40)
Albumin/Globulin Ratio: 1.9 (ref 1.2–2.2)
Albumin: 4.6 g/dL (ref 3.8–4.9)
Alkaline Phosphatase: 68 IU/L (ref 44–121)
BUN/Creatinine Ratio: 19 (ref 9–20)
BUN: 17 mg/dL (ref 6–24)
Bilirubin Total: 0.3 mg/dL (ref 0.0–1.2)
CO2: 21 mmol/L (ref 20–29)
Calcium: 9.6 mg/dL (ref 8.7–10.2)
Chloride: 101 mmol/L (ref 96–106)
Creatinine, Ser: 0.91 mg/dL (ref 0.76–1.27)
Globulin, Total: 2.4 g/dL (ref 1.5–4.5)
Glucose: 138 mg/dL — ABNORMAL HIGH (ref 70–99)
Potassium: 3.7 mmol/L (ref 3.5–5.2)
Sodium: 139 mmol/L (ref 134–144)
Total Protein: 7 g/dL (ref 6.0–8.5)
eGFR: 99 mL/min/{1.73_m2} (ref >59)

## 2022-01-17 LAB — LIPID PANEL
Chol/HDL Ratio: 2.3 ratio (ref 0.0–5.0)
Cholesterol, Total: 189 mg/dL (ref 100–199)
HDL: 81 mg/dL (ref >39)
LDL Chol Calc (NIH): 95 mg/dL (ref 0–99)
Triglycerides: 73 mg/dL (ref 0–149)
VLDL Cholesterol Cal: 13 mg/dL (ref 5–40)

## 2022-01-17 LAB — PSA: Prostate Specific Ag, Serum: 1.8 ng/mL (ref 0.0–4.0)

## 2022-01-17 LAB — TSH: TSH: 1.51 u[IU]/mL (ref 0.450–4.500)

## 2022-01-17 MED ORDER — SINECATECHINS 15 % EX OINT
1.0000 | TOPICAL_OINTMENT | Freq: Every day | CUTANEOUS | 1 refills | Status: DC
Start: 2022-01-17 — End: 2022-01-31

## 2022-01-17 NOTE — Telephone Encounter (Signed)
James Shaffer if any issues with filling rx, left detailed message about medication sent to pharmacy.

## 2022-01-17 NOTE — Progress Notes (Signed)
Please let patient know that overall his lab work looks good.  His glucose is elevated.  I'd like him to come back and have his A1c checked which is a measure of diabetes. Please make him a lab appt.    No other concerns at this time.  Follow up as discussed.

## 2022-01-17 NOTE — Addendum Note (Signed)
Addended by: Jon Billings on: 01/17/2022 08:25 AM   Modules accepted: Orders

## 2022-01-17 NOTE — Telephone Encounter (Signed)
PA has been denied for Zyclara pump. Per denial letter dx code is considered an experimental or investigational use for this drug or product. Is there an alternative?   Please advise.

## 2022-01-18 NOTE — Telephone Encounter (Signed)
Receipt confirmed 01/16/22 at 8:45. Requested Prescriptions  Pending Prescriptions Disp Refills  . VEREGEN 15 % OINT [Pharmacy Med Name: VEREGEN 15% OINTMENT] 30 g 1    Sig: APPLY 1 APPLICATION. TOPICALLY DAILY.     Off-Protocol Failed - 01/17/2022  9:51 AM      Failed - Medication not assigned to a protocol, review manually.      Passed - Valid encounter within last 12 months    Recent Outpatient Visits          2 days ago Annual physical exam   [place], NP   6 months ago Essential hypertension   [facility] Jon Billings, NP   9 months ago Viral upper respiratory tract infection   [facility] Jon Billings, NP   1 year ago Essential hypertension   [place], Jolene T, NP   1 year ago Essential hypertension   [place], Jessica A, NP      Future Appointments            In 1 month Jon Billings, NP [facility], [place]

## 2022-01-25 ENCOUNTER — Other Ambulatory Visit: Payer: Federal, State, Local not specified - PPO

## 2022-01-30 ENCOUNTER — Telehealth: Payer: Self-pay | Admitting: Nurse Practitioner

## 2022-01-30 ENCOUNTER — Other Ambulatory Visit: Payer: Self-pay | Admitting: Nurse Practitioner

## 2022-01-30 ENCOUNTER — Other Ambulatory Visit: Payer: Federal, State, Local not specified - PPO

## 2022-01-30 DIAGNOSIS — R7309 Other abnormal glucose: Secondary | ICD-10-CM

## 2022-01-30 NOTE — Telephone Encounter (Signed)
Can we please call the pharmacy and find out if there is anything we can do to help get the Sinecatechins approved.

## 2022-01-30 NOTE — Telephone Encounter (Signed)
PA needed will initiate.

## 2022-01-31 LAB — HEMOGLOBIN A1C
Est. average glucose Bld gHb Est-mCnc: 97 mg/dL
Hgb A1c MFr Bld: 5 % (ref 4.8–5.6)

## 2022-01-31 NOTE — Telephone Encounter (Signed)
Provider sent in new medication.

## 2022-01-31 NOTE — Telephone Encounter (Signed)
Can medication be changed as suggested or complete PA?

## 2022-01-31 NOTE — Progress Notes (Signed)
Please let patient know that his A1c was normal.  No evidence of diabetes.

## 2022-01-31 NOTE — Telephone Encounter (Signed)
Note on prescription from pharmacy advised that a PA is indicated and and alternative drug requested.  Forwarding to office.

## 2022-02-08 ENCOUNTER — Other Ambulatory Visit: Payer: Self-pay | Admitting: Nurse Practitioner

## 2022-02-08 NOTE — Telephone Encounter (Signed)
Requested medications are due for refill today.  no  Requested medications are on the active medications list.  yes  Last refill. 01/31/2022 30g 1 refill  Future visit scheduled.   yes  Notes to clinic.  Note from pharmacy:  Pharmacy comment: Alternative Requested:THIS MED IS [phone] PLEASE SEND IN ALT.   Requested Prescriptions  Pending Prescriptions Disp Refills   VEREGEN 15 % OINT [Pharmacy Med Name: VEREGEN 15% OINTMENT] 30 g 1    Sig: APPLY 1 APPLICATION. TOPICALLY DAILY.     Off-Protocol Failed - 02/08/2022  4:12 PM      Failed - Medication not assigned to a protocol, review manually.      Passed - Valid encounter within last 12 months    Recent Outpatient Visits           3 weeks ago Annual physical exam   [place], NP   6 months ago Essential hypertension   [facility] Jon Billings, NP   10 months ago Viral upper respiratory tract infection   [facility] Jon Billings, NP   1 year ago Essential hypertension   [place], Jolene T, NP   1 year ago Essential hypertension   [place], Jessica A, NP       Future Appointments             In 1 week Jon Billings, NP [facility], [place]

## 2022-02-20 ENCOUNTER — Telehealth: Payer: Self-pay

## 2022-02-20 ENCOUNTER — Ambulatory Visit (INDEPENDENT_AMBULATORY_CARE_PROVIDER_SITE_OTHER): Payer: Federal, State, Local not specified - PPO | Admitting: Nurse Practitioner

## 2022-02-20 ENCOUNTER — Encounter: Payer: Self-pay | Admitting: Nurse Practitioner

## 2022-02-20 VITALS — BP 116/72 | HR 67 | Temp 98.5°F | Wt 156.0 lb

## 2022-02-20 DIAGNOSIS — I10 Essential (primary) hypertension: Secondary | ICD-10-CM

## 2022-02-20 MED ORDER — IMIQUIMOD PUMP 3.75 % EX CREA: 1.0000 "application " | TOPICAL_CREAM | Freq: Every day | CUTANEOUS | 1 refills | Status: DC

## 2022-02-20 NOTE — Progress Notes (Signed)
BP 116/72   Pulse 67   Temp 98.5 F (36.9 C) (Oral)   Wt 156 lb (70.8 kg)   SpO2 98%   BMI 23.57 kg/m    Subjective:    Patient ID: James Shaffer, male    DOB: [birth year], 56 y.o.   MRN: [id number]  HPI: [name] is a 56 y.o. male  Chief Complaint  Patient presents with   Hypertension    Follow up- no concerns at this time    HYPERTENSION Hypertension status: controlled  Satisfied with current treatment? no Duration of hypertension: years BP monitoring frequency:  not checking BP range:  BP medication side effects:  no Medication compliance: excellent compliance Previous BP meds:amlodipine and lisinopril Aspirin: no Recurrent headaches: no Visual changes: no Palpitations: no Dyspnea: no Chest pain: no Lower extremity edema: no Dizzy/lightheaded: no  Relevant past medical, surgical, family and social history reviewed and updated as indicated. Interim medical history since our last visit reviewed. Allergies and medications reviewed and updated.  Review of Systems  Eyes:  Negative for visual disturbance.  Respiratory:  Negative for shortness of breath.   Cardiovascular:  Negative for chest pain and leg swelling.  Neurological:  Negative for light-headedness and headaches.    Per HPI unless specifically indicated above     Objective:    BP 116/72   Pulse 67   Temp 98.5 F (36.9 C) (Oral)   Wt 156 lb (70.8 kg)   SpO2 98%   BMI 23.57 kg/m   Wt Readings from Last 3 Encounters:  02/20/22 156 lb (70.8 kg)  01/16/22 158 lb 12.8 oz (72 kg)  07/18/21 164 lb 6.4 oz (74.6 kg)    Physical Exam Vitals and nursing note reviewed.  Constitutional:      General: He is not in acute distress.    Appearance: Normal appearance. He is not ill-appearing, toxic-appearing or diaphoretic.  HENT:     Head: Normocephalic.     Right Ear: External ear normal.     Left Ear: External ear normal.     Nose: Nose normal. No congestion or rhinorrhea.     Mouth/Throat:      Mouth: Mucous membranes are moist.  Eyes:     General:        Right eye: No discharge.        Left eye: No discharge.     Extraocular Movements: Extraocular movements intact.     Conjunctiva/sclera: Conjunctivae normal.     Pupils: Pupils are equal, round, and reactive to light.  Cardiovascular:     Rate and Rhythm: Normal rate and regular rhythm.     Heart sounds: No murmur heard. Pulmonary:     Effort: Pulmonary effort is normal. No respiratory distress.     Breath sounds: Normal breath sounds. No wheezing, rhonchi or rales.  Abdominal:     General: Abdomen is flat. Bowel sounds are normal.  Musculoskeletal:     Cervical back: Normal range of motion and neck supple.  Skin:    General: Skin is warm and dry.     Capillary Refill: Capillary refill takes less than 2 seconds.  Neurological:     General: No focal deficit present.     Mental Status: He is alert and oriented to person, place, and time.  Psychiatric:        Mood and Affect: Mood normal.        Behavior: Behavior normal.        Thought Content:  Thought content normal.        Judgment: Judgment normal.     Results for orders placed or performed in visit on 01/30/22  HgB A1c  Result Value Ref Range   Hgb A1c MFr Bld 5.0 4.8 - 5.6 %   Est. average glucose Bld gHb Est-mCnc 97 mg/dL      Assessment & Plan:   Problem List Items Addressed This Visit       Cardiovascular and Mediastinum   Essential hypertension - Primary    Chronic.  Controlled.  Continue with current medication regimen of Lisinopril 40mg  and Amlodipine 10mg  daily. Return to clinic in 6 months for reevaluation.  Call sooner if concerns arise.          Follow up plan: Return in about 6 months (around 08/22/2022) for BP Check.

## 2022-02-22 ENCOUNTER — Encounter: Payer: Self-pay | Admitting: Nurse Practitioner

## 2022-03-06 MED ORDER — IMIQUIMOD PUMP 3.75 % EX CREA
1.0000 | TOPICAL_CREAM | Freq: Every day | CUTANEOUS | 1 refills | Status: DC
Start: 2022-03-06 — End: 2022-03-22

## 2022-03-20 ENCOUNTER — Other Ambulatory Visit: Payer: Self-pay | Admitting: Nurse Practitioner

## 2022-03-21 NOTE — Telephone Encounter (Signed)
Requested medication (s) are due for refill today: yes  Requested medication (s) are on the active medication list: yes  Last refill:  03/06/22  Future visit scheduled: yes  Notes to clinic:  please advise below  podofilox (CONDYLOX) 0.5 % external solution       Changed from: Imiquimod Pump 3.75 % CREA   Sig: Please specify directions, refills and quantity   Disp:  3.5 mL    Refills:  1   Start: 03/20/2022   Class: Normal   Non-formulary   Last ordered: 2 weeks ago (03/06/2022) by Jon Billings, NP   Rx #: [id number]   Pharmacy comment: Product Backordered/Unavailable:PRESCRIBED PRODUCT NOT IN STOCK. PLEASE CONSIDER THIS POTENTIAL ALTERNATIVE FOR EXTERNAL GENITAL WARTS AND EVALUATE IF APPROPRIATE FOR YOUR PATIENT'S INDICATION AND THERAPY GOALS.       Requested Prescriptions  Pending Prescriptions Disp Refills   podofilox (CONDYLOX) 0.5 % external solution [Pharmacy Med Name: PODOFILOX 0.5% TOPICAL SOLN] 3.5 mL 1    Sig: Please specify directions, refills and quantity     Dermatology:  Other Passed - 03/20/2022  2:34 PM      Passed - Valid encounter within last 12 months    Recent Outpatient Visits           4 weeks ago Essential hypertension   [facility] Jon Billings, NP   2 months ago Annual physical exam   [facility] Jon Billings, NP   8 months ago Essential hypertension   [facility] Jon Billings, NP   11 months ago Viral upper respiratory tract infection   [id number] Jon Billings, NP   1 year ago Essential hypertension   [place], Jolene T, NP       Future Appointments             In 5 months Jon Billings, NP [facility], [place]

## 2022-03-24 ENCOUNTER — Telehealth: Payer: Self-pay

## 2022-03-24 NOTE — Telephone Encounter (Signed)
[facility] sent fax stating to please specify directions, refills and quantity on Podofilox 0.5% topical solution. Please advise.

## 2022-03-27 MED ORDER — PODOFILOX 0.5 % EX SOLN: CUTANEOUS | 1 refills | Status: DC

## 2022-03-27 NOTE — Addendum Note (Signed)
Addended by: Jon Billings on: 03/27/2022 08:22 AM   Modules accepted: Orders

## 2022-03-27 NOTE — Telephone Encounter (Signed)
Medication sent to the pharmacy.

## 2022-04-07 ENCOUNTER — Other Ambulatory Visit: Payer: Self-pay | Admitting: Nurse Practitioner

## 2022-04-07 NOTE — Telephone Encounter (Signed)
Requested medication (s) are due for refill today:no  Requested medication (s) are on the active medication list: yes  Last refill:  01/16/22  Future visit scheduled: yes  Notes to clinic:  Unable to refill per protocol, Rx request is too soon, last refill 01/16/22 for 90 and 1 refill, routing for review.      Requested Prescriptions  Pending Prescriptions Disp Refills   amLODipine (NORVASC) 10 MG tablet 90 tablet 1    Sig: Take 1 tablet (10 mg total) by mouth daily.     Cardiovascular: Calcium Channel Blockers 2 Passed - 04/07/2022 11:42 AM      Passed - Last BP in normal range    BP Readings from Last 1 Encounters:  02/20/22 116/72         Passed - Last Heart Rate in normal range    Pulse Readings from Last 1 Encounters:  02/20/22 67         Passed - Valid encounter within last 6 months    Recent Outpatient Visits           1 month ago Essential hypertension   [facility] Jon Billings, NP   2 months ago Annual physical exam   [facility] Jon Billings, NP   8 months ago Essential hypertension   [facility], NP   1 year ago Viral upper respiratory tract infection   [facility] Jon Billings, NP   1 year ago Essential hypertension   [place], Jolene T, NP       Future Appointments             In 4 months Jon Billings, NP [facility], [place]

## 2022-04-07 NOTE — Telephone Encounter (Signed)
Medication Refill - Medication: amLODipine (NORVASC) 10 MG tablet  Has the patient contacted their pharmacy? No.   Preferred Pharmacy (with phone number or street name):  [id number] [address]  [phone] Fax:  [phone]    Has the patient been seen for an appointment in the last year OR does the patient have an upcoming appointment? Yes.    Agent: Please be advised that RX refills may take up to 3 business days. We ask that you follow-up with your pharmacy.

## 2022-07-02 ENCOUNTER — Other Ambulatory Visit: Payer: Self-pay | Admitting: Nurse Practitioner

## 2022-07-03 ENCOUNTER — Other Ambulatory Visit: Payer: Self-pay | Admitting: Nurse Practitioner

## 2022-07-03 NOTE — Telephone Encounter (Signed)
Requested Prescriptions  Pending Prescriptions Disp Refills   amLODipine (NORVASC) 10 MG tablet [Pharmacy Med Name: AMLODIPINE BESYLATE 10 MG TAB] 90 tablet 0    Sig: TAKE 1 TABLET BY MOUTH EVERY DAY     Cardiovascular: Calcium Channel Blockers 2 Passed - 07/02/2022  8:45 AM      Passed - Last BP in normal range    BP Readings from Last 1 Encounters:  02/20/22 116/72         Passed - Last Heart Rate in normal range    Pulse Readings from Last 1 Encounters:  02/20/22 67         Passed - Valid encounter within last 6 months    Recent Outpatient Visits           4 months ago Essential hypertension   [facility] Jon Billings, NP   5 months ago Annual physical exam   [facility] Jon Billings, NP   11 months ago Essential hypertension   [facility] Jon Billings, NP   1 year ago Viral upper respiratory tract infection   [facility] Jon Billings, NP   1 year ago Essential hypertension   [place], Jolene T, NP       Future Appointments             In 1 month Jon Billings, NP [facility], [place]

## 2022-07-03 NOTE — Telephone Encounter (Signed)
Dc'd 01/16/22 by K. Holdsworth NP reorder  Requested Prescriptions  Refused Prescriptions Disp Refills   amLODipine (NORVASC) 5 MG tablet [Pharmacy Med Name: AMLODIPINE BESYLATE 5 MG TAB] 90 tablet 1    Sig: TAKE 1 TABLET (5 MG TOTAL) BY MOUTH DAILY.     Cardiovascular: Calcium Channel Blockers 2 Passed - 07/03/2022  1:43 AM      Passed - Last BP in normal range    BP Readings from Last 1 Encounters:  02/20/22 116/72         Passed - Last Heart Rate in normal range    Pulse Readings from Last 1 Encounters:  02/20/22 67         Passed - Valid encounter within last 6 months    Recent Outpatient Visits           4 months ago Essential hypertension   [facility] Jon Billings, NP   5 months ago Annual physical exam   [facility] Jon Billings, NP   11 months ago Essential hypertension   [facility] Jon Billings, NP   1 year ago Viral upper respiratory tract infection   [facility] Jon Billings, NP   1 year ago Essential hypertension   [place], Jolene T, NP       Future Appointments             In 1 month Jon Billings, NP [facility], [place]

## 2022-08-22 ENCOUNTER — Encounter: Payer: Self-pay | Admitting: Nurse Practitioner

## 2022-08-22 ENCOUNTER — Ambulatory Visit: Payer: Federal, State, Local not specified - PPO | Admitting: Nurse Practitioner

## 2022-08-22 VITALS — BP 135/78 | HR 79 | Temp 97.6°F | Wt 158.4 lb

## 2022-08-22 DIAGNOSIS — Z23 Encounter for immunization: Secondary | ICD-10-CM | POA: Diagnosis not present

## 2022-08-22 DIAGNOSIS — I10 Essential (primary) hypertension: Secondary | ICD-10-CM

## 2022-08-22 MED ORDER — LISINOPRIL 40 MG PO TABS: 40.0000 mg | ORAL_TABLET | Freq: Every day | ORAL | 1 refills | Status: DC

## 2022-08-22 MED ORDER — AMLODIPINE BESYLATE 10 MG PO TABS: 10.0000 mg | ORAL_TABLET | Freq: Every day | ORAL | 1 refills | Status: DC

## 2022-08-22 NOTE — Progress Notes (Signed)
BP 135/78   Pulse 79   Temp 97.6 F (36.4 C) (Oral)   Wt 158 lb 6.4 oz (71.8 kg)   SpO2 98%   BMI 23.94 kg/m    Subjective:    Patient ID: James Shaffer, male    DOB: Apr 14, 1966, 56 y.o.   MRN: [id number]  HPI: James Shaffer is a 56 y.o. male  Chief Complaint  Patient presents with   Hypertension   HYPERTENSION Hypertension status: controlled  Satisfied with current treatment? no Duration of hypertension: years BP monitoring frequency:  not checking BP range:  BP medication side effects:  no Medication compliance: excellent compliance Previous BP meds:amlodipine and lisinopril Aspirin: no Recurrent headaches: no Visual changes: no Palpitations: no Dyspnea: no Chest pain: no Lower extremity edema: no Dizzy/lightheaded: no  Relevant past medical, surgical, family and social history reviewed and updated as indicated. Interim medical history since our last visit reviewed. Allergies and medications reviewed and updated.  Review of Systems  Eyes:  Negative for visual disturbance.  Respiratory:  Negative for shortness of breath.   Cardiovascular:  Negative for chest pain and leg swelling.  Neurological:  Negative for light-headedness and headaches.    Per HPI unless specifically indicated above     Objective:    BP 135/78   Pulse 79   Temp 97.6 F (36.4 C) (Oral)   Wt 158 lb 6.4 oz (71.8 kg)   SpO2 98%   BMI 23.94 kg/m   Wt Readings from Last 3 Encounters:  08/22/22 158 lb 6.4 oz (71.8 kg)  02/20/22 156 lb (70.8 kg)  01/16/22 158 lb 12.8 oz (72 kg)    Physical Exam Vitals and nursing note reviewed.  Constitutional:      General: He is not in acute distress.    Appearance: Normal appearance. He is not ill-appearing, toxic-appearing or diaphoretic.  HENT:     Head: Normocephalic.     Right Ear: External ear normal.     Left Ear: External ear normal.     Nose: Nose normal. No congestion or rhinorrhea.     Mouth/Throat:     Mouth: Mucous membranes  are moist.  Eyes:     General:        Right eye: No discharge.        Left eye: No discharge.     Extraocular Movements: Extraocular movements intact.     Conjunctiva/sclera: Conjunctivae normal.     Pupils: Pupils are equal, round, and reactive to light.  Cardiovascular:     Rate and Rhythm: Normal rate and regular rhythm.     Heart sounds: No murmur heard. Pulmonary:     Effort: Pulmonary effort is normal. No respiratory distress.     Breath sounds: Normal breath sounds. No wheezing, rhonchi or rales.  Abdominal:     General: Abdomen is flat. Bowel sounds are normal.  Musculoskeletal:     Cervical back: Normal range of motion and neck supple.  Skin:    General: Skin is warm and dry.     Capillary Refill: Capillary refill takes less than 2 seconds.  Neurological:     General: No focal deficit present.     Mental Status: He is alert and oriented to person, place, and time.  Psychiatric:        Mood and Affect: Mood normal.        Behavior: Behavior normal.        Thought Content: Thought content normal.  Judgment: Judgment normal.     Results for orders placed or performed in visit on 01/30/22  HgB A1c  Result Value Ref Range   Hgb A1c MFr Bld 5.0 4.8 - 5.6 %   Est. average glucose Bld gHb Est-mCnc 97 mg/dL      Assessment & Plan:   Problem List Items Addressed This Visit       Cardiovascular and Mediastinum   Essential hypertension - Primary    Chronic.  Controlled.  Continue with current medication regimen of Lisinopril 54m and Amlodipine 117mdaily. Refills sent today.  Return to clinic in 6 months for reevaluation.  Call sooner if concerns arise.        Relevant Medications   amLODipine (NORVASC) 10 MG tablet   lisinopril (ZESTRIL) 40 MG tablet   Other Relevant Orders   Comp Met (CMET)   Other Visit Diagnoses     Need for influenza vaccination       Relevant Orders   Flu Vaccine QUAD 42m78mo (Fluarix, Fluzone & Alfiuria Quad PF) (Completed)         Follow up plan: Return in about 6 months (around 02/21/2023) for Physical and Fasting labs.

## 2022-08-22 NOTE — Assessment & Plan Note (Signed)
Chronic.  Controlled.  Continue with current medication regimen of Lisinopril '40mg'$  and Amlodipine '10mg'$  daily. Refills sent today.  Return to clinic in 6 months for reevaluation.  Call sooner if concerns arise.

## 2022-08-23 LAB — COMPREHENSIVE METABOLIC PANEL
ALT: 34 IU/L (ref 0–44)
AST: 53 IU/L — ABNORMAL HIGH (ref 0–40)
Albumin/Globulin Ratio: 1.6 (ref 1.2–2.2)
Albumin: 4.1 g/dL (ref 3.8–4.9)
Alkaline Phosphatase: 87 IU/L (ref 44–121)
BUN/Creatinine Ratio: 15 (ref 9–20)
BUN: 16 mg/dL (ref 6–24)
Bilirubin Total: 0.3 mg/dL (ref 0.0–1.2)
CO2: 22 mmol/L (ref 20–29)
Calcium: 9.1 mg/dL (ref 8.7–10.2)
Chloride: 98 mmol/L (ref 96–106)
Creatinine, Ser: 1.1 mg/dL (ref 0.76–1.27)
Globulin, Total: 2.6 g/dL (ref 1.5–4.5)
Glucose: 86 mg/dL (ref 70–99)
Potassium: 4.4 mmol/L (ref 3.5–5.2)
Sodium: 136 mmol/L (ref 134–144)
Total Protein: 6.7 g/dL (ref 6.0–8.5)
eGFR: 79 mL/min/{1.73_m2} (ref >59)

## 2022-08-24 ENCOUNTER — Encounter: Payer: Self-pay | Admitting: Nurse Practitioner

## 2023-02-21 ENCOUNTER — Other Ambulatory Visit: Payer: Self-pay | Admitting: Nurse Practitioner

## 2023-02-21 ENCOUNTER — Ambulatory Visit
Admission: RE | Admit: 2023-02-21 | Discharge: 2023-02-21 | Disposition: A | Discharge status: Home / Self Care | Payer: Federal, State, Local not specified - PPO | Source: Home / Self Care | Attending: Nurse Practitioner | Admitting: Nurse Practitioner

## 2023-02-21 ENCOUNTER — Encounter: Payer: Self-pay | Admitting: Nurse Practitioner

## 2023-02-21 ENCOUNTER — Ambulatory Visit
Admission: RE | Admit: 2023-02-21 | Discharge: 2023-02-21 | Disposition: A | Discharge status: Home / Self Care | Payer: Federal, State, Local not specified - PPO | Source: Ambulatory Visit | Attending: Nurse Practitioner | Admitting: Nurse Practitioner

## 2023-02-21 ENCOUNTER — Ambulatory Visit (INDEPENDENT_AMBULATORY_CARE_PROVIDER_SITE_OTHER): Payer: Federal, State, Local not specified - PPO | Admitting: Nurse Practitioner

## 2023-02-21 VITALS — BP 130/81 | HR 67 | Temp 97.5°F | Ht 69.0 in | Wt 155.0 lb

## 2023-02-21 DIAGNOSIS — M25552 Pain in left hip: Secondary | ICD-10-CM

## 2023-02-21 DIAGNOSIS — Z136 Encounter for screening for cardiovascular disorders: Secondary | ICD-10-CM | POA: Diagnosis not present

## 2023-02-21 DIAGNOSIS — Z1211 Encounter for screening for malignant neoplasm of colon: Secondary | ICD-10-CM

## 2023-02-21 DIAGNOSIS — R7309 Other abnormal glucose: Secondary | ICD-10-CM

## 2023-02-21 DIAGNOSIS — M545 Low back pain, unspecified: Secondary | ICD-10-CM

## 2023-02-21 DIAGNOSIS — I10 Essential (primary) hypertension: Secondary | ICD-10-CM

## 2023-02-21 DIAGNOSIS — Z0000 Encounter for general adult medical examination without abnormal findings: Secondary | ICD-10-CM | POA: Diagnosis not present

## 2023-02-21 LAB — URINALYSIS, ROUTINE W REFLEX MICROSCOPIC
Bilirubin, UA: NEGATIVE
Glucose, UA: NEGATIVE
Ketones, UA: NEGATIVE
Leukocytes,UA: NEGATIVE
Nitrite, UA: NEGATIVE
Protein,UA: NEGATIVE
RBC, UA: NEGATIVE
Specific Gravity, UA: 1.015 (ref 1.005–1.030)
Urobilinogen, Ur: 0.2 mg/dL (ref 0.2–1.0)
pH, UA: 5.5 (ref 5.0–7.5)

## 2023-02-21 MED ORDER — LISINOPRIL 40 MG PO TABS: 40.0000 mg | ORAL_TABLET | Freq: Every day | ORAL | 1 refills | Status: DC

## 2023-02-21 MED ORDER — AMLODIPINE BESYLATE 10 MG PO TABS: 10.0000 mg | ORAL_TABLET | Freq: Every day | ORAL | 1 refills | Status: DC

## 2023-02-21 NOTE — Assessment & Plan Note (Signed)
Chronic.  Controlled.  Continue with current medication regimen of Amlodipine and Lisinopril.  Refills sent today.  Labs ordered today.  Return to clinic in 6 months for reevaluation.  Call sooner if concerns arise.   

## 2023-02-21 NOTE — Progress Notes (Signed)
BP 130/81   Pulse 67   Temp (!) 97.5 F (36.4 C) (Oral)   Ht 5\' 9"  (1.753 m)   Wt 155 lb (70.3 kg)   SpO2 99%   BMI 22.89 kg/m    Subjective:    Patient ID: James Shaffer, male    DOB: 18-Jul-1966, 57 y.o.   MRN: [id number]  HPI: James Shaffer is a 57 y.o. male presenting on 02/21/2023 for comprehensive medical examination. Current medical complaints include: falls  He currently lives with: Interim Problems from his last visit: no  HYPERTENSION Hypertension status: controlled Satisfied with current treatment? yes Duration of hypertension: years BP monitoring frequency:  not checking BP range:  BP medication side effects:  no Medication compliance: excellent compliance Previous BP meds:amlodipine and lisinopril Aspirin: no Recurrent headaches: no Visual changes: no Palpitations: no Dyspnea: no Chest pain: no Lower extremity edema: no Dizzy/lightheaded: no  Patient states he has fallen a couple of times and hit the same area a couple of times.  He hasn't taken anything for pain.    Depression Screen done today and results listed below:     02/21/2023   10:01 AM 08/22/2022   11:21 AM 02/20/2022   10:29 AM 01/16/2022    8:19 AM 07/18/2021   10:38 AM  Depression screen PHQ 2/9  Decreased Interest 0 0 1 1 0  Down, Depressed, Hopeless 1 1 1 1 1   PHQ - 2 Score 1 1 2 2 1   Altered sleeping 1 1 2 2  0  Tired, decreased energy 0 1 0 1 0  Change in appetite 0 0 0 0 0  Feeling bad or failure about yourself  1 1 1 1 1   Trouble concentrating 0 0 0 0 0  Moving slowly or fidgety/restless 0 0 0 0 0  Suicidal thoughts 0 0 0 0 0  PHQ-9 Score 3 4 5 6 2   Difficult doing work/chores Not difficult at all Not difficult at all Not difficult at all Not difficult at all Not difficult at all    The patient does a history of falls. I did complete a risk assessment for falls. A plan of care for falls was documented.   Past Medical History:  Past Medical History:  Diagnosis Date    Hodgkin's lymphoma (HCC)    Hypertension    Kidney stones     Surgical History:  Past Surgical History:  Procedure Laterality Date   BONE MARROW BIOPSY     hodgkins lymphoma   VASECTOMY      Medications:  No current outpatient medications on file prior to visit.   No current facility-administered medications on file prior to visit.    Allergies:  No Known Allergies  Social History:  Social History   Socioeconomic History   Marital status: Single    Spouse name: Not on file   Number of children: Not on file   Years of education: Not on file   Highest education level: Not on file  Occupational History   Not on file  Tobacco Use   Smoking status: Former    Years: 1    Types: Cigarettes   Smokeless tobacco: Never  Vaping Use   Vaping Use: Never used  Substance and Sexual Activity   Alcohol use: Yes    Alcohol/week: 25.0 standard drinks of alcohol    Types: 25 Shots of liquor per week    Comment: 5 night per week   Drug use: No   Sexual  activity: Yes  Other Topics Concern   Not on file  Social History Narrative   Not on file   Social Determinants of Health   Financial Resource Strain: Not on file  Food Insecurity: Not on file  Transportation Needs: Not on file  Physical Activity: Not on file  Stress: Not on file  Social Connections: Not on file  Intimate Partner Violence: Not on file   Social History   Tobacco Use  Smoking Status Former   Years: 1   Types: Cigarettes  Smokeless Tobacco Never   Social History   Substance and Sexual Activity  Alcohol Use Yes   Alcohol/week: 25.0 standard drinks of alcohol   Types: 25 Shots of liquor per week   Comment: 5 night per week    Family History:  Family History  Problem Relation Age of Onset   Cancer Mother        lung   Skin cancer Father    Cataracts Father    Kidney Stones Maternal Grandfather    Kidney cancer Neg Hx    Bladder Cancer Neg Hx    Prostate cancer Neg Hx     Past medical  history, surgical history, medications, allergies, family history and social history reviewed with patient today and changes made to appropriate areas of the chart.   Review of Systems  Eyes:  Negative for blurred vision and double vision.  Respiratory:  Negative for shortness of breath.   Cardiovascular:  Negative for chest pain, palpitations and leg swelling.  Musculoskeletal:  Positive for back pain.  Neurological:  Negative for dizziness and headaches.   All other ROS negative except what is listed above and in the HPI.      Objective:    BP 130/81   Pulse 67   Temp (!) 97.5 F (36.4 C) (Oral)   Ht 5\' 9"  (1.753 m)   Wt 155 lb (70.3 kg)   SpO2 99%   BMI 22.89 kg/m   Wt Readings from Last 3 Encounters:  02/21/23 155 lb (70.3 kg)  08/22/22 158 lb 6.4 oz (71.8 kg)  02/20/22 156 lb (70.8 kg)    Physical Exam Vitals and nursing note reviewed.  Constitutional:      General: He is not in acute distress.    Appearance: Normal appearance. He is not ill-appearing, toxic-appearing or diaphoretic.  HENT:     Head: Normocephalic.     Right Ear: Tympanic membrane, ear canal and external ear normal.     Left Ear: Tympanic membrane, ear canal and external ear normal.     Nose: Nose normal. No congestion or rhinorrhea.     Mouth/Throat:     Mouth: Mucous membranes are moist.  Eyes:     General:        Right eye: No discharge.        Left eye: No discharge.     Extraocular Movements: Extraocular movements intact.     Conjunctiva/sclera: Conjunctivae normal.     Pupils: Pupils are equal, round, and reactive to light.  Cardiovascular:     Rate and Rhythm: Normal rate and regular rhythm.     Heart sounds: No murmur heard. Pulmonary:     Effort: Pulmonary effort is normal. No respiratory distress.     Breath sounds: Normal breath sounds. No wheezing, rhonchi or rales.  Abdominal:     General: Abdomen is flat. Bowel sounds are normal. There is no distension.     Palpations: Abdomen  is soft.  Tenderness: There is no abdominal tenderness. There is no guarding.  Musculoskeletal:     Cervical back: Normal range of motion and neck supple.  Skin:    General: Skin is warm and dry.     Capillary Refill: Capillary refill takes less than 2 seconds.  Neurological:     General: No focal deficit present.     Mental Status: He is alert and oriented to person, place, and time.     Cranial Nerves: No cranial nerve deficit.     Motor: No weakness.     Deep Tendon Reflexes: Reflexes normal.  Psychiatric:        Mood and Affect: Mood normal.        Behavior: Behavior normal.        Thought Content: Thought content normal.        Judgment: Judgment normal.     Results for orders placed or performed in visit on 08/22/22  Comp Met (CMET)  Result Value Ref Range   Glucose 86 70 - 99 mg/dL   BUN 16 6 - 24 mg/dL   Creatinine, Ser 6.29 0.76 - 1.27 mg/dL   eGFR 79 >52 WU/XLK/4.40   BUN/Creatinine Ratio 15 9 - 20   Sodium 136 134 - 144 mmol/L   Potassium 4.4 3.5 - 5.2 mmol/L   Chloride 98 96 - 106 mmol/L   CO2 22 20 - 29 mmol/L   Calcium 9.1 8.7 - 10.2 mg/dL   Total Protein 6.7 6.0 - 8.5 g/dL   Albumin 4.1 3.8 - 4.9 g/dL   Globulin, Total 2.6 1.5 - 4.5 g/dL   Albumin/Globulin Ratio 1.6 1.2 - 2.2   Bilirubin Total 0.3 0.0 - 1.2 mg/dL   Alkaline Phosphatase 87 44 - 121 IU/L   AST 53 (H) 0 - 40 IU/L   ALT 34 0 - 44 IU/L      Assessment & Plan:   Problem List Items Addressed This Visit       Cardiovascular and Mediastinum   Essential hypertension    Chronic.  Controlled.  Continue with current medication regimen of Amlodipine and Lisinopril.  Refills sent today.  Labs ordered today.  Return to clinic in 6 months for reevaluation.  Call sooner if concerns arise.        Relevant Medications   amLODipine (NORVASC) 10 MG tablet   lisinopril (ZESTRIL) 40 MG tablet     Other   Elevated glucose    Labs ordered at visit today.  Will make recommendations based on lab  results.        Relevant Orders   HgB A1c   Other Visit Diagnoses     Annual physical exam    -  Primary   Health maintenance reviewed during visit today.  Labs ordered.  Vaccines reviewed.  Cologuard ordered.   Relevant Orders   TSH   PSA   Lipid panel   CBC with Differential/Platelet   Comprehensive metabolic panel   Urinalysis, Routine w reflex microscopic   Lumbar pain       Xray ordered at visit today. Will make recommendations based on imaging results.  Recommend Tylenol PRN for pain.   Relevant Orders   DG Lumbar Spine Complete   Screening for ischemic heart disease       Relevant Orders   Lipid panel   Screening for colon cancer       Relevant Orders   Cologuard        Discussed aspirin prophylaxis for  myocardial infarction prevention and decision was it was not indicated  LABORATORY TESTING:  Health maintenance labs ordered today as discussed above.   The natural history of prostate cancer and ongoing controversy regarding screening and potential treatment outcomes of prostate cancer has been discussed with the patient. The meaning of a false positive PSA and a false negative PSA has been discussed. He indicates understanding of the limitations of this screening test and wishes to proceed with screening PSA testing.   IMMUNIZATIONS:   - Tdap: Tetanus vaccination status reviewed: last tetanus booster within 10 years. - Influenza: Postponed to flu season - Pneumovax: Not applicable - Prevnar: Not applicable - COVID: Up to date - HPV: Not applicable - Shingrix vaccine:  Discussed at visit today.  SCREENING: - Colonoscopy:  Cologuard ordered at previous visit.  Patient has box.   Discussed with patient purpose of the colonoscopy is to detect colon cancer at curable precancerous or early stages   - AAA Screening: Not applicable  -Hearing Test: Not applicable  -Spirometry: Not applicable   PATIENT COUNSELING:    Sexuality: Discussed sexually transmitted  diseases, partner selection, use of condoms, avoidance of unintended pregnancy  and contraceptive alternatives.   Advised to avoid cigarette smoking.  I discussed with the patient that most people either abstain from alcohol or drink within safe limits (<=14/week and <=4 drinks/occasion for males, <=7/weeks and <= 3 drinks/occasion for females) and that the risk for alcohol disorders and other health effects rises proportionally with the number of drinks per week and how often a drinker exceeds daily limits.  Discussed cessation/primary prevention of drug use and availability of treatment for abuse.   Diet: Encouraged to adjust caloric intake to maintain  or achieve ideal body weight, to reduce intake of dietary saturated fat and total fat, to limit sodium intake by avoiding high sodium foods and not adding table salt, and to maintain adequate dietary potassium and calcium preferably from fresh fruits, vegetables, and low-fat dairy products.    stressed the importance of regular exercise  Injury prevention: Discussed safety belts, safety helmets, smoke detector, smoking near bedding or upholstery.   Dental health: Discussed importance of regular tooth brushing, flossing, and dental visits.   Follow up plan: NEXT PREVENTATIVE PHYSICAL DUE IN 1 YEAR. Return in about 6 months (around 08/23/2023) for HTN, HLD, DM2 FU.

## 2023-02-21 NOTE — Assessment & Plan Note (Signed)
Labs ordered at visit today.  Will make recommendations based on lab results.   

## 2023-02-22 LAB — CBC WITH DIFFERENTIAL/PLATELET
Basophils Absolute: 0.1 10*3/uL (ref 0.0–0.2)
Basos: 1 %
EOS (ABSOLUTE): 0.1 10*3/uL (ref 0.0–0.4)
Eos: 1 %
Hematocrit: 42.3 % (ref 37.5–51.0)
Hemoglobin: 14.1 g/dL (ref 13.0–17.7)
Immature Grans (Abs): 0 10*3/uL (ref 0.0–0.1)
Immature Granulocytes: 0 %
Lymphocytes Absolute: 1.2 10*3/uL (ref 0.7–3.1)
Lymphs: 17 %
MCH: 33.3 pg — ABNORMAL HIGH (ref 26.6–33.0)
MCHC: 33.3 g/dL (ref 31.5–35.7)
MCV: 100 fL — ABNORMAL HIGH (ref 79–97)
Monocytes Absolute: 0.6 10*3/uL (ref 0.1–0.9)
Monocytes: 9 %
Neutrophils Absolute: 5.1 10*3/uL (ref 1.4–7.0)
Neutrophils: 72 %
Platelets: 275 10*3/uL (ref 150–450)
RBC: 4.23 x10E6/uL (ref 4.14–5.80)
RDW: 13 % (ref 11.6–15.4)
WBC: 7 10*3/uL (ref 3.4–10.8)

## 2023-02-22 LAB — PSA: Prostate Specific Ag, Serum: 1.5 ng/mL (ref 0.0–4.0)

## 2023-02-22 LAB — COMPREHENSIVE METABOLIC PANEL
ALT: 31 IU/L (ref 0–44)
AST: 37 IU/L (ref 0–40)
Albumin: 4.4 g/dL (ref 3.8–4.9)
Alkaline Phosphatase: 89 IU/L (ref 44–121)
BUN/Creatinine Ratio: 16 (ref 9–20)
BUN: 19 mg/dL (ref 6–24)
Bilirubin Total: 0.4 mg/dL (ref 0.0–1.2)
CO2: 20 mmol/L (ref 20–29)
Calcium: 9.6 mg/dL (ref 8.7–10.2)
Chloride: 101 mmol/L (ref 96–106)
Creatinine, Ser: 1.2 mg/dL (ref 0.76–1.27)
Globulin, Total: 2.6 g/dL (ref 1.5–4.5)
Glucose: 93 mg/dL (ref 70–99)
Potassium: 4.9 mmol/L (ref 3.5–5.2)
Sodium: 138 mmol/L (ref 134–144)
Total Protein: 7 g/dL (ref 6.0–8.5)
eGFR: 71 mL/min/{1.73_m2} (ref >59)

## 2023-02-22 LAB — LIPID PANEL
Chol/HDL Ratio: 2.2 ratio (ref 0.0–5.0)
Cholesterol, Total: 177 mg/dL (ref 100–199)
HDL: 80 mg/dL (ref >39)
LDL Chol Calc (NIH): 83 mg/dL (ref 0–99)
Triglycerides: 74 mg/dL (ref 0–149)
VLDL Cholesterol Cal: 14 mg/dL (ref 5–40)

## 2023-02-22 LAB — HEMOGLOBIN A1C
Est. average glucose Bld gHb Est-mCnc: 100 mg/dL
Hgb A1c MFr Bld: 5.1 % (ref 4.8–5.6)

## 2023-02-22 LAB — TSH: TSH: 2.54 u[IU]/mL (ref 0.450–4.500)

## 2023-02-22 NOTE — Progress Notes (Signed)
[name]. It was nice to see you yesterday.  Your lab work looks good.  No concerns at this time. Continue with your current medication regimen.  Follow up as discussed.  Please let me know if you have any questions.

## 2023-02-26 NOTE — Progress Notes (Signed)
[name]. Your xray was normal.  If the symptoms persist, I recommend you see Orthopedics.

## 2023-08-23 ENCOUNTER — Ambulatory Visit: Payer: Federal, State, Local not specified - PPO | Admitting: Nurse Practitioner

## 2023-08-28 ENCOUNTER — Ambulatory Visit: Payer: Federal, State, Local not specified - PPO | Admitting: Nurse Practitioner

## 2023-08-28 ENCOUNTER — Encounter: Payer: Self-pay | Admitting: Nurse Practitioner

## 2023-08-28 VITALS — BP 114/71 | HR 62 | Temp 98.0°F | Resp 14 | Wt 156.0 lb

## 2023-08-28 DIAGNOSIS — I10 Essential (primary) hypertension: Secondary | ICD-10-CM | POA: Diagnosis not present

## 2023-08-28 DIAGNOSIS — Z23 Encounter for immunization: Secondary | ICD-10-CM | POA: Diagnosis not present

## 2023-08-28 DIAGNOSIS — Z113 Encounter for screening for infections with a predominantly sexual mode of transmission: Secondary | ICD-10-CM

## 2023-08-28 DIAGNOSIS — R7309 Other abnormal glucose: Secondary | ICD-10-CM

## 2023-08-28 DIAGNOSIS — D229 Melanocytic nevi, unspecified: Secondary | ICD-10-CM

## 2023-08-28 DIAGNOSIS — Z1211 Encounter for screening for malignant neoplasm of colon: Secondary | ICD-10-CM

## 2023-08-28 MED ORDER — AMLODIPINE BESYLATE 10 MG PO TABS: 10.0000 mg | ORAL_TABLET | Freq: Every day | ORAL | 1 refills | Status: DC

## 2023-08-28 MED ORDER — LISINOPRIL 40 MG PO TABS: 40.0000 mg | ORAL_TABLET | Freq: Every day | ORAL | 1 refills | Status: DC

## 2023-08-28 NOTE — Assessment & Plan Note (Signed)
 Labs ordered at visit today.  Will make recommendations based on lab results.

## 2023-08-28 NOTE — Progress Notes (Signed)
 BP 114/71 (BP Location: Left Arm, Patient Position: Sitting, Cuff Size: Normal)   Pulse 62   Temp 98 F (36.7 C) (Oral)   Resp 14   Wt 156 lb (70.8 kg)   SpO2 99%   BMI 23.04 kg/m    Subjective:    Patient ID: James Shaffer, male    DOB: 08-29-1965, 57 y.o.   MRN: [id number]  HPI: James Shaffer is a 57 y.o. male  Chief Complaint  Patient presents with   Hypertension   Hyperlipidemia   Diabetes   skin concern   Exposure to STD    Would like to be tested   Torticollis    Generally worse but been out of work for vacation   HYPERTENSION Hypertension status: controlled  Satisfied with current treatment? no Duration of hypertension: years BP monitoring frequency:  not checking BP range:  BP medication side effects:  no Medication compliance: excellent compliance Previous BP meds:amlodipine  and lisinopril  Aspirin: no Recurrent headaches: no Visual changes: no Palpitations: no Dyspnea: no Chest pain: no Lower extremity edema: no Dizzy/lightheaded: no  STD SCREENING Sexual activity:  Recent unprotected sexual encounter Contraception: no Recent unprotected intercourse: yes History of sexually transmitted diseases: no Previous sexually transmitted disease screening: yes Not currently having any symptoms.    Relevant past medical, surgical, family and social history reviewed and updated as indicated. Interim medical history since our last visit reviewed. Allergies and medications reviewed and updated.  Review of Systems  Eyes:  Negative for visual disturbance.  Respiratory:  Negative for shortness of breath.   Cardiovascular:  Negative for chest pain and leg swelling.  Neurological:  Negative for light-headedness and headaches.    Per HPI unless specifically indicated above     Objective:    BP 114/71 (BP Location: Left Arm, Patient Position: Sitting, Cuff Size: Normal)   Pulse 62   Temp 98 F (36.7 C) (Oral)   Resp 14   Wt 156 lb (70.8 kg)   SpO2 99%    BMI 23.04 kg/m   Wt Readings from Last 3 Encounters:  08/28/23 156 lb (70.8 kg)  02/21/23 155 lb (70.3 kg)  08/22/22 158 lb 6.4 oz (71.8 kg)    Physical Exam Vitals and nursing note reviewed.  Constitutional:      General: He is not in acute distress.    Appearance: Normal appearance. He is not ill-appearing, toxic-appearing or diaphoretic.  HENT:     Head: Normocephalic.     Right Ear: External ear normal.     Left Ear: External ear normal.     Nose: Nose normal. No congestion or rhinorrhea.     Mouth/Throat:     Mouth: Mucous membranes are moist.  Eyes:     General:        Right eye: No discharge.        Left eye: No discharge.     Extraocular Movements: Extraocular movements intact.     Conjunctiva/sclera: Conjunctivae normal.     Pupils: Pupils are equal, round, and reactive to light.  Cardiovascular:     Rate and Rhythm: Normal rate and regular rhythm.     Heart sounds: No murmur heard. Pulmonary:     Effort: Pulmonary effort is normal. No respiratory distress.     Breath sounds: Normal breath sounds. No wheezing, rhonchi or rales.  Abdominal:     General: Abdomen is flat. Bowel sounds are normal.  Musculoskeletal:     Cervical back: Normal range of  motion and neck supple.  Skin:    General: Skin is warm and dry.     Capillary Refill: Capillary refill takes less than 2 seconds.  Neurological:     General: No focal deficit present.     Mental Status: He is alert and oriented to person, place, and time.  Psychiatric:        Mood and Affect: Mood normal.        Behavior: Behavior normal.        Thought Content: Thought content normal.        Judgment: Judgment normal.     Results for orders placed or performed in visit on 02/21/23  Urinalysis, Routine w reflex microscopic   Collection Time: 02/21/23 10:17 AM  Result Value Ref Range   Specific Gravity, UA 1.015 1.005 - 1.030   pH, UA 5.5 5.0 - 7.5   Color, UA Yellow Yellow   Appearance Ur Clear Clear    Leukocytes,UA Negative Negative   Protein,UA Negative Negative/Trace   Glucose, UA Negative Negative   Ketones, UA Negative Negative   RBC, UA Negative Negative   Bilirubin, UA Negative Negative   Urobilinogen, Ur 0.2 0.2 - 1.0 mg/dL   Nitrite, UA Negative Negative   Microscopic Examination Comment   TSH   Collection Time: 02/21/23 10:20 AM  Result Value Ref Range   TSH 2.540 0.450 - 4.500 uIU/mL  PSA   Collection Time: 02/21/23 10:20 AM  Result Value Ref Range   Prostate Specific Ag, Serum 1.5 0.0 - 4.0 ng/mL  Lipid panel   Collection Time: 02/21/23 10:20 AM  Result Value Ref Range   Cholesterol, Total 177 100 - 199 mg/dL   Triglycerides 74 0 - 149 mg/dL   HDL 80 >60 mg/dL   VLDL Cholesterol Cal 14 5 - 40 mg/dL   LDL Chol Calc (NIH) 83 0 - 99 mg/dL   Chol/HDL Ratio 2.2 0.0 - 5.0 ratio  CBC with Differential/Platelet   Collection Time: 02/21/23 10:20 AM  Result Value Ref Range   WBC 7.0 3.4 - 10.8 x10E3/uL   RBC 4.23 4.14 - 5.80 x10E6/uL   Hemoglobin 14.1 13.0 - 17.7 g/dL   Hematocrit 57.6 62.4 - 51.0 %   MCV 100 (H) 79 - 97 fL   MCH 33.3 (H) 26.6 - 33.0 pg   MCHC 33.3 31.5 - 35.7 g/dL   RDW 86.9 88.3 - 84.5 %   Platelets 275 150 - 450 x10E3/uL   Neutrophils 72 Not Estab. %   Lymphs 17 Not Estab. %   Monocytes 9 Not Estab. %   Eos 1 Not Estab. %   Basos 1 Not Estab. %   Neutrophils Absolute 5.1 1.4 - 7.0 x10E3/uL   Lymphocytes Absolute 1.2 0.7 - 3.1 x10E3/uL   Monocytes Absolute 0.6 0.1 - 0.9 x10E3/uL   EOS (ABSOLUTE) 0.1 0.0 - 0.4 x10E3/uL   Basophils Absolute 0.1 0.0 - 0.2 x10E3/uL   Immature Granulocytes 0 Not Estab. %   Immature Grans (Abs) 0.0 0.0 - 0.1 x10E3/uL  Comprehensive metabolic panel   Collection Time: 02/21/23 10:20 AM  Result Value Ref Range   Glucose 93 70 - 99 mg/dL   BUN 19 6 - 24 mg/dL   Creatinine, Ser 8.79 0.76 - 1.27 mg/dL   eGFR 71 >40 fO/fpw/8.26   BUN/Creatinine Ratio 16 9 - 20   Sodium 138 134 - 144 mmol/L   Potassium 4.9 3.5 - 5.2  mmol/L   Chloride 101 96 - 106  mmol/L   CO2 20 20 - 29 mmol/L   Calcium 9.6 8.7 - 10.2 mg/dL   Total Protein 7.0 6.0 - 8.5 g/dL   Albumin 4.4 3.8 - 4.9 g/dL   Globulin, Total 2.6 1.5 - 4.5 g/dL   Bilirubin Total 0.4 0.0 - 1.2 mg/dL   Alkaline Phosphatase 89 44 - 121 IU/L   AST 37 0 - 40 IU/L   ALT 31 0 - 44 IU/L  HgB A1c   Collection Time: 02/21/23 10:20 AM  Result Value Ref Range   Hgb A1c MFr Bld 5.1 4.8 - 5.6 %   Est. average glucose Bld gHb Est-mCnc 100 mg/dL      Assessment & Plan:   Problem List Items Addressed This Visit       Cardiovascular and Mediastinum   Essential hypertension   Chronic.  Controlled.  Continue with current medication regimen of Lisinopril  40mg  and Amlodipine  10mg .  Refills sent today. Labs ordered today.  Return to clinic in 6 months for reevaluation.  Call sooner if concerns arise.        Relevant Medications   amLODipine  (NORVASC ) 10 MG tablet   lisinopril  (ZESTRIL ) 40 MG tablet   Other Relevant Orders   Comp Met (CMET)     Other   Elevated glucose - Primary   Labs ordered at visit today.  Will make recommendations based on lab results.        Relevant Orders   HgB A1c   Other Visit Diagnoses       Need for influenza vaccination       Relevant Orders   Flu vaccine trivalent PF, 6mos and older(Flulaval,Afluria,Fluarix,Fluzone)     Screening for colon cancer       Relevant Orders   Cologuard     Screening examination for STI       Relevant Orders   HIV Antibody (routine testing w rflx)   RPR   Hepatitis C antibody   Chlamydia/Gonococcus/Trichomonas, NAA     Atypical mole       Relevant Orders   Ambulatory referral to Dermatology        Follow up plan: Return in about 6 months (around 02/25/2024) for Physical and Fasting labs.

## 2023-08-28 NOTE — Assessment & Plan Note (Signed)
Chronic.  Controlled.  Continue with current medication regimen of Lisinopril 40mg  and Amlodipine 10mg .  Refills sent today.  Labs ordered today.  Return to clinic in 6 months for reevaluation.  Call sooner if concerns arise.

## 2023-08-30 LAB — CHLAMYDIA/GONOCOCCUS/TRICHOMONAS, NAA
Chlamydia by NAA: NEGATIVE
Gonococcus by NAA: NEGATIVE
Trich vag by NAA: NEGATIVE

## 2023-08-31 LAB — COMPREHENSIVE METABOLIC PANEL
ALT: 63 [IU]/L — ABNORMAL HIGH (ref 0–44)
AST: 70 [IU]/L — ABNORMAL HIGH (ref 0–40)
Albumin: 4.4 g/dL (ref 3.8–4.9)
Alkaline Phosphatase: 127 [IU]/L — ABNORMAL HIGH (ref 44–121)
BUN/Creatinine Ratio: 16 (ref 9–20)
BUN: 19 mg/dL (ref 6–24)
Bilirubin Total: 0.6 mg/dL (ref 0.0–1.2)
CO2: 22 mmol/L (ref 20–29)
Calcium: 9.4 mg/dL (ref 8.7–10.2)
Chloride: 99 mmol/L (ref 96–106)
Creatinine, Ser: 1.2 mg/dL (ref 0.76–1.27)
Globulin, Total: 2.6 g/dL (ref 1.5–4.5)
Glucose: 90 mg/dL (ref 70–99)
Potassium: 4.8 mmol/L (ref 3.5–5.2)
Sodium: 137 mmol/L (ref 134–144)
Total Protein: 7 g/dL (ref 6.0–8.5)
eGFR: 71 mL/min/{1.73_m2} (ref >59)

## 2023-08-31 LAB — HEMOGLOBIN A1C
Est. average glucose Bld gHb Est-mCnc: 105 mg/dL
Hgb A1c MFr Bld: 5.3 % (ref 4.8–5.6)

## 2023-08-31 LAB — HEPATITIS C ANTIBODY: Hep C Virus Ab: NONREACTIVE

## 2023-08-31 LAB — HIV ANTIBODY (ROUTINE TESTING W REFLEX): HIV Screen 4th Generation wRfx: NONREACTIVE

## 2023-08-31 LAB — RPR: RPR Ser Ql: NONREACTIVE

## 2023-09-04 ENCOUNTER — Encounter: Payer: Self-pay | Admitting: Nurse Practitioner

## 2023-10-17 DIAGNOSIS — C44519 Basal cell carcinoma of skin of other part of trunk: Secondary | ICD-10-CM | POA: Diagnosis not present

## 2023-10-17 DIAGNOSIS — D485 Neoplasm of uncertain behavior of skin: Secondary | ICD-10-CM | POA: Diagnosis not present

## 2023-10-17 DIAGNOSIS — L821 Other seborrheic keratosis: Secondary | ICD-10-CM | POA: Diagnosis not present

## 2023-11-12 DIAGNOSIS — C44519 Basal cell carcinoma of skin of other part of trunk: Secondary | ICD-10-CM | POA: Diagnosis not present

## 2023-11-12 DIAGNOSIS — C4491 Basal cell carcinoma of skin, unspecified: Secondary | ICD-10-CM | POA: Diagnosis not present

## 2023-11-26 DIAGNOSIS — C4491 Basal cell carcinoma of skin, unspecified: Secondary | ICD-10-CM | POA: Diagnosis not present

## 2023-11-26 DIAGNOSIS — C44519 Basal cell carcinoma of skin of other part of trunk: Secondary | ICD-10-CM | POA: Diagnosis not present

## 2024-02-25 ENCOUNTER — Ambulatory Visit (INDEPENDENT_AMBULATORY_CARE_PROVIDER_SITE_OTHER): Payer: Self-pay | Admitting: Nurse Practitioner

## 2024-02-25 ENCOUNTER — Encounter: Payer: Self-pay | Admitting: Nurse Practitioner

## 2024-02-25 VITALS — BP 136/95 | HR 58 | Ht 69.0 in | Wt 150.0 lb

## 2024-02-25 DIAGNOSIS — Z0000 Encounter for general adult medical examination without abnormal findings: Secondary | ICD-10-CM | POA: Diagnosis not present

## 2024-02-25 DIAGNOSIS — R7309 Other abnormal glucose: Secondary | ICD-10-CM | POA: Diagnosis not present

## 2024-02-25 DIAGNOSIS — D229 Melanocytic nevi, unspecified: Secondary | ICD-10-CM

## 2024-02-25 DIAGNOSIS — Z1211 Encounter for screening for malignant neoplasm of colon: Secondary | ICD-10-CM | POA: Diagnosis not present

## 2024-02-25 DIAGNOSIS — I10 Essential (primary) hypertension: Secondary | ICD-10-CM | POA: Diagnosis not present

## 2024-02-25 DIAGNOSIS — Z136 Encounter for screening for cardiovascular disorders: Secondary | ICD-10-CM

## 2024-02-25 MED ORDER — AMLODIPINE BESYLATE 10 MG PO TABS: 10.0000 mg | ORAL_TABLET | Freq: Every day | ORAL | 1 refills | Status: DC

## 2024-02-25 MED ORDER — OLMESARTAN MEDOXOMIL 40 MG PO TABS: 40.0000 mg | ORAL_TABLET | Freq: Every day | ORAL | 1 refills | Status: DC

## 2024-02-25 NOTE — Assessment & Plan Note (Signed)
 Chronic. Patient having a cough with lisinopril .  Will change to Olmesartan.  Side effects and benefits of medication discussed.  Labs ordered.  Follow up in 6 weeks.  Call sooner if concerns arise.

## 2024-02-25 NOTE — Assessment & Plan Note (Signed)
 Labs ordered at visit today.  Will make recommendations based on lab results.

## 2024-02-25 NOTE — Progress Notes (Signed)
 BP (!) 136/95   Pulse (!) 58   Ht 5' 9 (1.753 m)   Wt 150 lb (68 kg)   SpO2 98%   BMI 22.15 kg/m    Subjective:    Patient ID: James Shaffer, male    DOB: 1966-08-04, 58 y.o.   MRN: [id number]  HPI: James Shaffer is a 58 y.o. male presenting on 02/25/2024 for comprehensive medical examination. Current medical complaints include: falls  He currently lives with: Interim Problems from his last visit: no  HYPERTENSION Patient states he is having a cough and sensitivity to sunlight. Hypertension status: controlled Satisfied with current treatment? yes Duration of hypertension: years BP monitoring frequency:  not checking BP range:  BP medication side effects:  no Medication compliance: excellent compliance Previous BP meds:amlodipine  and lisinopril  Aspirin: no Recurrent headaches: no Visual changes: no Palpitations: no Dyspnea: no Chest pain: no Lower extremity edema: no Dizzy/lightheaded: no  Depression Screen done today and results listed below:     02/25/2024    9:33 AM 02/21/2023   10:01 AM 08/22/2022   11:21 AM 02/20/2022   10:29 AM 01/16/2022    8:19 AM  Depression screen PHQ 2/9  Decreased Interest 0 0 0 1 1  Down, Depressed, Hopeless 0 1 1 1 1   PHQ - 2 Score 0 1 1 2 2   Altered sleeping 0 1 1 2 2   Tired, decreased energy 0 0 1 0 1  Change in appetite 0 0 0 0 0  Feeling bad or failure about yourself  0 1 1 1 1   Trouble concentrating 0 0 0 0 0  Moving slowly or fidgety/restless 0 0 0 0 0  Suicidal thoughts 0 0 0 0 0  PHQ-9 Score 0 3 4 5 6   Difficult doing work/chores Not difficult at all Not difficult at all Not difficult at all Not difficult at all Not difficult at all    The patient does a history of falls. I did complete a risk assessment for falls. A plan of care for falls was documented.   Past Medical History:  Past Medical History:  Diagnosis Date   Hodgkin's lymphoma (HCC)    Hypertension    Kidney stones     Surgical History:  Past  Surgical History:  Procedure Laterality Date   BONE MARROW BIOPSY     hodgkins lymphoma   VASECTOMY      Medications:  Current Outpatient Medications on File Prior to Visit  Medication Sig   ibuprofen (ADVIL) 200 MG tablet Take 200 mg by mouth every 6 (six) hours as needed.   No current facility-administered medications on file prior to visit.    Allergies:  No Known Allergies  Social History:  Social History   Socioeconomic History   Marital status: Single    Spouse name: Not on file   Number of children: Not on file   Years of education: Not on file   Highest education level: Not on file  Occupational History   Not on file  Tobacco Use   Smoking status: Former    Types: Cigarettes   Smokeless tobacco: Never  Vaping Use   Vaping status: Never Used  Substance and Sexual Activity   Alcohol use: Yes    Alcohol/week: 25.0 standard drinks of alcohol    Types: 25 Shots of liquor per week    Comment: 5 night per week   Drug use: Yes    Types: Marijuana   Sexual activity: Yes  Other Topics Concern   Not on file  Social History Narrative   Not on file   Social Drivers of Health   Financial Resource Strain: Low Risk  (08/28/2023)   Overall Financial Resource Strain (CARDIA)    Difficulty of Paying Living Expenses: Not hard at all  Food Insecurity: Not on file  Transportation Needs: Not on file  Physical Activity: Sufficiently Active (08/28/2023)   Exercise Vital Sign    Days of Exercise per Week: 3 days    Minutes of Exercise per Session: 120 min  Stress: No Stress Concern Present (08/28/2023)   [employer] of Occupational Health - Occupational Stress Questionnaire    Feeling of Stress : Only a little  Social Connections: Socially Isolated (08/28/2023)   Social Connection and Isolation Panel    Frequency of Communication with Friends and Family: Three times a week    Frequency of Social Gatherings with Friends and Family: Once a week    Attends Religious  Services: Never    Database administrator or Organizations: No    Attends Engineer, structural: Never    Marital Status: Divorced  Catering manager Violence: Not on file   Social History   Tobacco Use  Smoking Status Former   Types: Cigarettes  Smokeless Tobacco Never   Social History   Substance and Sexual Activity  Alcohol Use Yes   Alcohol/week: 25.0 standard drinks of alcohol   Types: 25 Shots of liquor per week   Comment: 5 night per week    Family History:  Family History  Problem Relation Age of Onset   Cancer Mother        lung   Skin cancer Father    Cataracts Father    Kidney Stones Maternal Grandfather    Kidney cancer Neg Hx    Bladder Cancer Neg Hx    Prostate cancer Neg Hx     Past medical history, surgical history, medications, allergies, family history and social history reviewed with patient today and changes made to appropriate areas of the chart.   Review of Systems  Eyes:  Negative for blurred vision and double vision.  Respiratory:  Negative for shortness of breath.   Cardiovascular:  Negative for chest pain, palpitations and leg swelling.  Musculoskeletal:  Positive for back pain.  Neurological:  Negative for dizziness and headaches.   All other ROS negative except what is listed above and in the HPI.      Objective:    BP (!) 136/95   Pulse (!) 58   Ht 5' 9 (1.753 m)   Wt 150 lb (68 kg)   SpO2 98%   BMI 22.15 kg/m   Wt Readings from Last 3 Encounters:  02/25/24 150 lb (68 kg)  08/28/23 156 lb (70.8 kg)  02/21/23 155 lb (70.3 kg)    Physical Exam Vitals and nursing note reviewed.  Constitutional:      General: He is not in acute distress.    Appearance: Normal appearance. He is not ill-appearing, toxic-appearing or diaphoretic.  HENT:     Head: Normocephalic.     Right Ear: Ear canal and external ear normal. There is impacted cerumen.     Left Ear: Ear canal and external ear normal. There is impacted cerumen.      Nose: Nose normal. No congestion or rhinorrhea.     Mouth/Throat:     Mouth: Mucous membranes are moist.   Eyes:     General:  Right eye: No discharge.        Left eye: No discharge.     Extraocular Movements: Extraocular movements intact.     Conjunctiva/sclera: Conjunctivae normal.     Pupils: Pupils are equal, round, and reactive to light.    Cardiovascular:     Rate and Rhythm: Normal rate and regular rhythm.     Heart sounds: No murmur heard. Pulmonary:     Effort: Pulmonary effort is normal. No respiratory distress.     Breath sounds: Normal breath sounds. No wheezing, rhonchi or rales.  Abdominal:     General: Abdomen is flat. Bowel sounds are normal. There is no distension.     Palpations: Abdomen is soft.     Tenderness: There is no abdominal tenderness. There is no guarding.   Musculoskeletal:     Cervical back: Normal range of motion and neck supple.   Skin:    General: Skin is warm and dry.     Capillary Refill: Capillary refill takes less than 2 seconds.   Neurological:     General: No focal deficit present.     Mental Status: He is alert and oriented to person, place, and time.     Cranial Nerves: No cranial nerve deficit.     Motor: No weakness.     Deep Tendon Reflexes: Reflexes normal.   Psychiatric:        Mood and Affect: Mood normal.        Behavior: Behavior normal.        Thought Content: Thought content normal.        Judgment: Judgment normal.     Results for orders placed or performed in visit on 08/28/23  Chlamydia/Gonococcus/Trichomonas, NAA   Collection Time: 08/28/23  8:56 AM   Specimen: Urine   UR  Result Value Ref Range   Chlamydia by NAA Negative Negative   Gonococcus by NAA Negative Negative   Trich vag by NAA Negative Negative  Comp Met (CMET)   Collection Time: 08/28/23  8:59 AM  Result Value Ref Range   Glucose 90 70 - 99 mg/dL   BUN 19 6 - 24 mg/dL   Creatinine, Ser 8.79 0.76 - 1.27 mg/dL   eGFR 71 >40 fO/fpw/8.26    BUN/Creatinine Ratio 16 9 - 20   Sodium 137 134 - 144 mmol/L   Potassium 4.8 3.5 - 5.2 mmol/L   Chloride 99 96 - 106 mmol/L   CO2 22 20 - 29 mmol/L   Calcium 9.4 8.7 - 10.2 mg/dL   Total Protein 7.0 6.0 - 8.5 g/dL   Albumin 4.4 3.8 - 4.9 g/dL   Globulin, Total 2.6 1.5 - 4.5 g/dL   Bilirubin Total 0.6 0.0 - 1.2 mg/dL   Alkaline Phosphatase 127 (H) 44 - 121 IU/L   AST 70 (H) 0 - 40 IU/L   ALT 63 (H) 0 - 44 IU/L  HgB A1c   Collection Time: 08/28/23  8:59 AM  Result Value Ref Range   Hgb A1c MFr Bld 5.3 4.8 - 5.6 %   Est. average glucose Bld gHb Est-mCnc 105 mg/dL  HIV Antibody (routine testing w rflx)   Collection Time: 08/28/23  8:59 AM  Result Value Ref Range   HIV Screen 4th Generation wRfx Non Reactive Non Reactive  RPR   Collection Time: 08/28/23  8:59 AM  Result Value Ref Range   RPR Ser Ql Non Reactive Non Reactive  Hepatitis C antibody   Collection Time: 08/28/23  8:59  AM  Result Value Ref Range   Hep C Virus Ab Non Reactive Non Reactive      Assessment & Plan:   Problem List Items Addressed This Visit       Cardiovascular and Mediastinum   Essential hypertension   Chronic. Patient having a cough with lisinopril .  Will change to Olmesartan.  Side effects and benefits of medication discussed.  Labs ordered.  Follow up in 6 weeks.  Call sooner if concerns arise.       Relevant Medications   olmesartan (BENICAR) 40 MG tablet   amLODipine  (NORVASC ) 10 MG tablet     Other   Elevated glucose   Labs ordered at visit today.  Will make recommendations based on lab results.        Relevant Orders   Hemoglobin A1c   Other Visit Diagnoses       Annual physical exam    -  Primary   Health maintenance reviewed during visit today.  Labs ordered.  Vaccines reviewed.  Cologuard ordered.   Relevant Orders   Hemoglobin A1c   TSH   PSA   Lipid panel   CBC with Differential/Platelet   Comprehensive metabolic panel with GFR     Screening for ischemic heart disease        Relevant Orders   Lipid panel     Screening for colon cancer       Relevant Orders   Cologuard     Atypical mole       Relevant Orders   Ambulatory referral to Dermatology        Discussed aspirin prophylaxis for myocardial infarction prevention and decision was it was not indicated  LABORATORY TESTING:  Health maintenance labs ordered today as discussed above.   The natural history of prostate cancer and ongoing controversy regarding screening and potential treatment outcomes of prostate cancer has been discussed with the patient. The meaning of a false positive PSA and a false negative PSA has been discussed. He indicates understanding of the limitations of this screening test and wishes to proceed with screening PSA testing.   IMMUNIZATIONS:   - Tdap: Tetanus vaccination status reviewed: last tetanus booster within 10 years. - Influenza: Postponed to flu season - Pneumovax: Not applicable - Prevnar: Not applicable - COVID: Up to date - HPV: Not applicable - Shingrix vaccine: Discussed at visit today.  SCREENING: - Colonoscopy: Cologuard ordered at previous visit.  Patient has box.  Discussed with patient purpose of the colonoscopy is to detect colon cancer at curable precancerous or early stages   - AAA Screening: Not applicable  -Hearing Test: Not applicable  -Spirometry: Not applicable   PATIENT COUNSELING:    Sexuality: Discussed sexually transmitted diseases, partner selection, use of condoms, avoidance of unintended pregnancy  and contraceptive alternatives.   Advised to avoid cigarette smoking.  I discussed with the patient that most people either abstain from alcohol or drink within safe limits (<=14/week and <=4 drinks/occasion for males, <=7/weeks and <= 3 drinks/occasion for females) and that the risk for alcohol disorders and other health effects rises proportionally with the number of drinks per week and how often a drinker exceeds daily  limits.  Discussed cessation/primary prevention of drug use and availability of treatment for abuse.   Diet: Encouraged to adjust caloric intake to maintain  or achieve ideal body weight, to reduce intake of dietary saturated fat and total fat, to limit sodium intake by avoiding high sodium foods and not adding table  salt, and to maintain adequate dietary potassium and calcium preferably from fresh fruits, vegetables, and low-fat dairy products.    stressed the importance of regular exercise  Injury prevention: Discussed safety belts, safety helmets, smoke detector, smoking near bedding or upholstery.   Dental health: Discussed importance of regular tooth brushing, flossing, and dental visits.   Follow up plan: NEXT PREVENTATIVE PHYSICAL DUE IN 1 YEAR. Return in about 6 weeks (around 04/07/2024) for BP Check.

## 2024-02-26 ENCOUNTER — Encounter: Payer: Self-pay | Admitting: Nurse Practitioner

## 2024-02-26 ENCOUNTER — Ambulatory Visit: Payer: Self-pay | Admitting: Nurse Practitioner

## 2024-02-26 LAB — COMPREHENSIVE METABOLIC PANEL WITH GFR
ALT: 24 IU/L (ref 0–44)
AST: 24 IU/L (ref 0–40)
Albumin: 4.5 g/dL (ref 3.8–4.9)
Alkaline Phosphatase: 95 IU/L (ref 44–121)
BUN/Creatinine Ratio: 16 (ref 9–20)
BUN: 18 mg/dL (ref 6–24)
Bilirubin Total: 0.5 mg/dL (ref 0.0–1.2)
CO2: 23 mmol/L (ref 20–29)
Calcium: 9.8 mg/dL (ref 8.7–10.2)
Chloride: 100 mmol/L (ref 96–106)
Creatinine, Ser: 1.12 mg/dL (ref 0.76–1.27)
Globulin, Total: 3 g/dL (ref 1.5–4.5)
Glucose: 86 mg/dL (ref 70–99)
Potassium: 4.2 mmol/L (ref 3.5–5.2)
Sodium: 138 mmol/L (ref 134–144)
Total Protein: 7.5 g/dL (ref 6.0–8.5)
eGFR: 76 mL/min/{1.73_m2} (ref >59)

## 2024-02-26 LAB — LIPID PANEL
Chol/HDL Ratio: 2.1 ratio (ref 0.0–5.0)
Cholesterol, Total: 173 mg/dL (ref 100–199)
HDL: 82 mg/dL (ref >39)
LDL Chol Calc (NIH): 79 mg/dL (ref 0–99)
Triglycerides: 65 mg/dL (ref 0–149)
VLDL Cholesterol Cal: 12 mg/dL (ref 5–40)

## 2024-02-26 LAB — CBC WITH DIFFERENTIAL/PLATELET
Basophils Absolute: 0 10*3/uL (ref 0.0–0.2)
Basos: 1 %
EOS (ABSOLUTE): 0.1 10*3/uL (ref 0.0–0.4)
Eos: 1 %
Hematocrit: 41.5 % (ref 37.5–51.0)
Hemoglobin: 13.9 g/dL (ref 13.0–17.7)
Immature Grans (Abs): 0 10*3/uL (ref 0.0–0.1)
Immature Granulocytes: 0 %
Lymphocytes Absolute: 1.4 10*3/uL (ref 0.7–3.1)
Lymphs: 24 %
MCH: 33.8 pg — ABNORMAL HIGH (ref 26.6–33.0)
MCHC: 33.5 g/dL (ref 31.5–35.7)
MCV: 101 fL — ABNORMAL HIGH (ref 79–97)
Monocytes Absolute: 0.4 10*3/uL (ref 0.1–0.9)
Monocytes: 8 %
Neutrophils Absolute: 3.7 10*3/uL (ref 1.4–7.0)
Neutrophils: 66 %
Platelets: 382 10*3/uL (ref 150–450)
RBC: 4.11 x10E6/uL — ABNORMAL LOW (ref 4.14–5.80)
RDW: 12.3 % (ref 11.6–15.4)
WBC: 5.7 10*3/uL (ref 3.4–10.8)

## 2024-02-26 LAB — HEMOGLOBIN A1C
Est. average glucose Bld gHb Est-mCnc: 100 mg/dL
Hgb A1c MFr Bld: 5.1 % (ref 4.8–5.6)

## 2024-02-26 LAB — PSA: Prostate Specific Ag, Serum: 1.9 ng/mL (ref 0.0–4.0)

## 2024-02-26 LAB — TSH: TSH: 1.58 u[IU]/mL (ref 0.450–4.500)

## 2024-03-12 DIAGNOSIS — D485 Neoplasm of uncertain behavior of skin: Secondary | ICD-10-CM | POA: Diagnosis not present

## 2024-03-12 DIAGNOSIS — D229 Melanocytic nevi, unspecified: Secondary | ICD-10-CM | POA: Diagnosis not present

## 2024-03-12 DIAGNOSIS — L578 Other skin changes due to chronic exposure to nonionizing radiation: Secondary | ICD-10-CM | POA: Diagnosis not present

## 2024-03-12 DIAGNOSIS — C44612 Basal cell carcinoma of skin of right upper limb, including shoulder: Secondary | ICD-10-CM | POA: Diagnosis not present

## 2024-03-12 DIAGNOSIS — L570 Actinic keratosis: Secondary | ICD-10-CM | POA: Diagnosis not present

## 2024-03-12 DIAGNOSIS — L821 Other seborrheic keratosis: Secondary | ICD-10-CM | POA: Diagnosis not present

## 2024-03-12 DIAGNOSIS — L814 Other melanin hyperpigmentation: Secondary | ICD-10-CM | POA: Diagnosis not present

## 2024-03-20 LAB — COLOGUARD: COLOGUARD: NEGATIVE

## 2024-03-29 ENCOUNTER — Other Ambulatory Visit: Payer: Self-pay | Admitting: Nurse Practitioner

## 2024-03-31 NOTE — Telephone Encounter (Signed)
 Requested Prescriptions  Refused Prescriptions Disp Refills   lisinopril  (ZESTRIL ) 40 MG tablet [Pharmacy Med Name: LISINOPRIL  40 MG TABLET] 90 tablet 1    Sig: TAKE 1 TABLET BY MOUTH EVERY DAY     Cardiovascular:  ACE Inhibitors Failed - 03/31/2024  2:46 PM      Failed - Last BP in normal range    BP Readings from Last 1 Encounters:  02/25/24 (!) 136/95         Passed - Cr in normal range and within 180 days    Creatinine, Ser  Date Value Ref Range Status  02/25/2024 1.12 0.76 - 1.27 mg/dL Final         Passed - K in normal range and within 180 days    Potassium  Date Value Ref Range Status  02/25/2024 4.2 3.5 - 5.2 mmol/L Final         Passed - Patient is not pregnant      Passed - Valid encounter within last 6 months    Recent Outpatient Visits           1 month ago Annual physical exam   [facility] Melvin Pao, NP

## 2024-04-14 DIAGNOSIS — C44612 Basal cell carcinoma of skin of right upper limb, including shoulder: Secondary | ICD-10-CM | POA: Diagnosis not present

## 2024-04-15 ENCOUNTER — Encounter: Payer: Self-pay | Admitting: Nurse Practitioner

## 2024-04-15 ENCOUNTER — Ambulatory Visit (INDEPENDENT_AMBULATORY_CARE_PROVIDER_SITE_OTHER): Admitting: Nurse Practitioner

## 2024-04-15 VITALS — BP 117/78 | HR 62 | Temp 97.9°F | Wt 150.4 lb

## 2024-04-15 DIAGNOSIS — I10 Essential (primary) hypertension: Secondary | ICD-10-CM

## 2024-04-15 NOTE — Progress Notes (Signed)
 BP 117/78   Pulse 62   Temp 97.9 F (36.6 C) (Oral)   Wt 150 lb 6.4 oz (68.2 kg)   SpO2 98%   BMI 22.21 kg/m    Subjective:    Patient ID: James Shaffer, male    DOB: Sep 10, 1965, 58 y.o.   MRN: [id number]  HPI: DEBRA CALABRETTA is a 58 y.o. male  Chief Complaint  Patient presents with   Hypertension   HYPERTENSION without Chronic Kidney Disease Cough and light sensitivity improved with stopping Lisinopril .  Hypertension status: controlled  Satisfied with current treatment? yes Duration of hypertension: years BP monitoring frequency:  not checking BP range:  BP medication side effects:  no Medication compliance: excellent compliance Previous BP meds:amlodipine  and olmesartan  (benicar ) Aspirin: no Recurrent headaches: no Visual changes: no Palpitations: no Dyspnea: no Chest pain: no Lower extremity edema: no Dizzy/lightheaded: no  Relevant past medical, surgical, family and social history reviewed and updated as indicated. Interim medical history since our last visit reviewed. Allergies and medications reviewed and updated.  Review of Systems  Eyes:  Negative for visual disturbance.  Respiratory:  Negative for shortness of breath.   Cardiovascular:  Negative for chest pain and leg swelling.  Neurological:  Negative for light-headedness and headaches.    Per HPI unless specifically indicated above     Objective:    BP 117/78   Pulse 62   Temp 97.9 F (36.6 C) (Oral)   Wt 150 lb 6.4 oz (68.2 kg)   SpO2 98%   BMI 22.21 kg/m   Wt Readings from Last 3 Encounters:  04/15/24 150 lb 6.4 oz (68.2 kg)  02/25/24 150 lb (68 kg)  08/28/23 156 lb (70.8 kg)    Physical Exam Vitals and nursing note reviewed.  Constitutional:      General: He is not in acute distress.    Appearance: Normal appearance. He is not ill-appearing, toxic-appearing or diaphoretic.  HENT:     Head: Normocephalic.     Right Ear: External ear normal.     Left Ear: External ear normal.      Nose: Nose normal. No congestion or rhinorrhea.     Mouth/Throat:     Mouth: Mucous membranes are moist.  Eyes:     General:        Right eye: No discharge.        Left eye: No discharge.     Extraocular Movements: Extraocular movements intact.     Conjunctiva/sclera: Conjunctivae normal.     Pupils: Pupils are equal, round, and reactive to light.  Cardiovascular:     Rate and Rhythm: Normal rate and regular rhythm.     Heart sounds: No murmur heard. Pulmonary:     Effort: Pulmonary effort is normal. No respiratory distress.     Breath sounds: Normal breath sounds. No wheezing, rhonchi or rales.  Abdominal:     General: Abdomen is flat. Bowel sounds are normal.  Musculoskeletal:     Cervical back: Normal range of motion and neck supple.  Skin:    General: Skin is warm and dry.     Capillary Refill: Capillary refill takes less than 2 seconds.  Neurological:     General: No focal deficit present.     Mental Status: He is alert and oriented to person, place, and time.  Psychiatric:        Mood and Affect: Mood normal.        Behavior: Behavior normal.  Thought Content: Thought content normal.        Judgment: Judgment normal.     Results for orders placed or performed in visit on 02/25/24  Hemoglobin A1c   Collection Time: 02/25/24  9:49 AM  Result Value Ref Range   Hgb A1c MFr Bld 5.1 4.8 - 5.6 %   Est. average glucose Bld gHb Est-mCnc 100 mg/dL  TSH   Collection Time: 02/25/24  9:49 AM  Result Value Ref Range   TSH 1.580 0.450 - 4.500 uIU/mL  PSA   Collection Time: 02/25/24  9:49 AM  Result Value Ref Range   Prostate Specific Ag, Serum 1.9 0.0 - 4.0 ng/mL  Lipid panel   Collection Time: 02/25/24  9:49 AM  Result Value Ref Range   Cholesterol, Total 173 100 - 199 mg/dL   Triglycerides 65 0 - 149 mg/dL   HDL 82 >60 mg/dL   VLDL Cholesterol Cal 12 5 - 40 mg/dL   LDL Chol Calc (NIH) 79 0 - 99 mg/dL   Chol/HDL Ratio 2.1 0.0 - 5.0 ratio  CBC with  Differential/Platelet   Collection Time: 02/25/24  9:49 AM  Result Value Ref Range   WBC 5.7 3.4 - 10.8 x10E3/uL   RBC 4.11 (L) 4.14 - 5.80 x10E6/uL   Hemoglobin 13.9 13.0 - 17.7 g/dL   Hematocrit 58.4 62.4 - 51.0 %   MCV 101 (H) 79 - 97 fL   MCH 33.8 (H) 26.6 - 33.0 pg   MCHC 33.5 31.5 - 35.7 g/dL   RDW 87.6 88.3 - 84.5 %   Platelets 382 150 - 450 x10E3/uL   Neutrophils 66 Not Estab. %   Lymphs 24 Not Estab. %   Monocytes 8 Not Estab. %   Eos 1 Not Estab. %   Basos 1 Not Estab. %   Neutrophils Absolute 3.7 1.4 - 7.0 x10E3/uL   Lymphocytes Absolute 1.4 0.7 - 3.1 x10E3/uL   Monocytes Absolute 0.4 0.1 - 0.9 x10E3/uL   EOS (ABSOLUTE) 0.1 0.0 - 0.4 x10E3/uL   Basophils Absolute 0.0 0.0 - 0.2 x10E3/uL   Immature Granulocytes 0 Not Estab. %   Immature Grans (Abs) 0.0 0.0 - 0.1 x10E3/uL  Comprehensive metabolic panel with GFR   Collection Time: 02/25/24  9:49 AM  Result Value Ref Range   Glucose 86 70 - 99 mg/dL   BUN 18 6 - 24 mg/dL   Creatinine, Ser 8.87 0.76 - 1.27 mg/dL   eGFR 76 >40 fO/fpw/8.26   BUN/Creatinine Ratio 16 9 - 20   Sodium 138 134 - 144 mmol/L   Potassium 4.2 3.5 - 5.2 mmol/L   Chloride 100 96 - 106 mmol/L   CO2 23 20 - 29 mmol/L   Calcium 9.8 8.7 - 10.2 mg/dL   Total Protein 7.5 6.0 - 8.5 g/dL   Albumin 4.5 3.8 - 4.9 g/dL   Globulin, Total 3.0 1.5 - 4.5 g/dL   Bilirubin Total 0.5 0.0 - 1.2 mg/dL   Alkaline Phosphatase 95 44 - 121 IU/L   AST 24 0 - 40 IU/L   ALT 24 0 - 44 IU/L  Cologuard   Collection Time: 03/13/24  4:15 PM  Result Value Ref Range   COLOGUARD Negative Negative      Assessment & Plan:   Problem List Items Addressed This Visit   None    Follow up plan: No follow-ups on file.

## 2024-04-15 NOTE — Assessment & Plan Note (Signed)
 Chronic.  Controlled.  Continue with current medication regimen of Amlodipine  and Olmesartan .  Labs ordered today.  Return to clinic in 6 months for reevaluation.  Call sooner if concerns arise.

## 2024-04-16 ENCOUNTER — Ambulatory Visit: Payer: Self-pay | Admitting: Nurse Practitioner

## 2024-04-16 LAB — COMPREHENSIVE METABOLIC PANEL WITH GFR
ALT: 37 IU/L (ref 0–44)
AST: 42 IU/L — ABNORMAL HIGH (ref 0–40)
Albumin: 4.4 g/dL (ref 3.8–4.9)
Alkaline Phosphatase: 88 IU/L (ref 44–121)
BUN/Creatinine Ratio: 15 (ref 9–20)
BUN: 17 mg/dL (ref 6–24)
Bilirubin Total: 0.6 mg/dL (ref 0.0–1.2)
CO2: 22 mmol/L (ref 20–29)
Calcium: 9.7 mg/dL (ref 8.7–10.2)
Chloride: 102 mmol/L (ref 96–106)
Creatinine, Ser: 1.17 mg/dL (ref 0.76–1.27)
Globulin, Total: 2.8 g/dL (ref 1.5–4.5)
Glucose: 74 mg/dL (ref 70–99)
Potassium: 4.4 mmol/L (ref 3.5–5.2)
Sodium: 142 mmol/L (ref 134–144)
Total Protein: 7.2 g/dL (ref 6.0–8.5)
eGFR: 72 mL/min/1.73 (ref >59)

## 2024-06-27 ENCOUNTER — Other Ambulatory Visit: Payer: Self-pay | Admitting: Nurse Practitioner

## 2024-06-28 NOTE — Telephone Encounter (Signed)
 Too soon for refill, LRF 02/25/24 for 90 and 1 RF.  Requested Prescriptions  Pending Prescriptions Disp Refills   olmesartan  (BENICAR ) 40 MG tablet [Pharmacy Med Name: OLMESARTAN  MEDOXOMIL 40 MG TAB] 90 tablet 1    Sig: TAKE 1 TABLET BY MOUTH EVERY DAY     Cardiovascular:  Angiotensin Receptor Blockers Passed - 06/28/2024  9:02 AM      Passed - Cr in normal range and within 180 days    Creatinine, Ser  Date Value Ref Range Status  04/15/2024 1.17 0.76 - 1.27 mg/dL Final         Passed - K in normal range and within 180 days    Potassium  Date Value Ref Range Status  04/15/2024 4.4 3.5 - 5.2 mmol/L Final         Passed - Patient is not pregnant      Passed - Last BP in normal range    BP Readings from Last 1 Encounters:  04/15/24 117/78         Passed - Valid encounter within last 6 months    Recent Outpatient Visits           2 months ago Essential hypertension   [place] [facility] Melvin Pao, NP   4 months ago Annual physical exam   [place] [facility] Melvin Pao, NP               ``

## 2024-06-30 ENCOUNTER — Telehealth: Payer: Self-pay | Admitting: Nurse Practitioner

## 2024-06-30 NOTE — Telephone Encounter (Signed)
 Contacted [facility] because according to chart, medication is not due to be refill until the end of December. Pharmacy states that the patient has a partial refill of 53 tablets left on file and they are getting it ready for him now.   Called patient and notified him of the above.

## 2024-06-30 NOTE — Telephone Encounter (Signed)
 Copied from CRM [id number]. Topic: Clinical - Red Word Triage >> Jun 30, 2024  2:27 PM James Shaffer wrote: Refused Olmesartan  Medoxomil 40 mg Oral Daily- pt wants to know why this was denied says it's for his bp Can be reached on Mychart

## 2024-08-19 ENCOUNTER — Other Ambulatory Visit: Payer: Self-pay | Admitting: Nurse Practitioner

## 2024-09-15 ENCOUNTER — Other Ambulatory Visit: Payer: Self-pay | Admitting: Nurse Practitioner

## 2024-09-16 NOTE — Telephone Encounter (Signed)
 Requested Prescriptions  Pending Prescriptions Disp Refills   olmesartan  (BENICAR ) 40 MG tablet [Pharmacy Med Name: OLMESARTAN  MEDOXOMIL 40 MG TAB] 90 tablet 0    Sig: TAKE 1 TABLET BY MOUTH EVERY DAY     Cardiovascular:  Angiotensin Receptor Blockers Passed - 09/16/2024 10:26 AM      Passed - Cr in normal range and within 180 days    Creatinine, Ser  Date Value Ref Range Status  04/15/2024 1.17 0.76 - 1.27 mg/dL Final         Passed - K in normal range and within 180 days    Potassium  Date Value Ref Range Status  04/15/2024 4.4 3.5 - 5.2 mmol/L Final         Passed - Patient is not pregnant      Passed - Last BP in normal range    BP Readings from Last 1 Encounters:  04/15/24 117/78         Passed - Valid encounter within last 6 months    Recent Outpatient Visits           5 months ago Essential hypertension   [place] [facility] Melvin Pao, NP   6 months ago Annual physical exam   [facility] [facility] Melvin Pao, NP

## 2024-09-17 ENCOUNTER — Ambulatory Visit: Admitting: Nurse Practitioner

## 2024-09-17 ENCOUNTER — Encounter: Payer: Self-pay | Admitting: Nurse Practitioner

## 2024-09-17 VITALS — BP 134/89 | HR 54 | Temp 97.5°F | Ht 69.02 in | Wt 158.2 lb

## 2024-09-17 DIAGNOSIS — Z23 Encounter for immunization: Secondary | ICD-10-CM | POA: Diagnosis not present

## 2024-09-17 DIAGNOSIS — R7309 Other abnormal glucose: Secondary | ICD-10-CM

## 2024-09-17 DIAGNOSIS — I10 Essential (primary) hypertension: Secondary | ICD-10-CM | POA: Diagnosis not present

## 2024-09-17 MED ORDER — AMLODIPINE BESYLATE 10 MG PO TABS: 10.0000 mg | ORAL_TABLET | Freq: Every day | ORAL | 1 refills | Status: AC

## 2024-09-17 MED ORDER — OLMESARTAN MEDOXOMIL 40 MG PO TABS: 40.0000 mg | ORAL_TABLET | Freq: Every day | ORAL | 1 refills | Status: AC

## 2024-09-17 NOTE — Assessment & Plan Note (Signed)
 Chronic.  Controlled.  Continue with current medication regimen of Amlodipine and Olmesartan.  Refills sent today.  Labs ordered today.  Return to clinic in 6 months for reevaluation.  Call sooner if concerns arise.

## 2024-09-17 NOTE — Progress Notes (Signed)
 "  BP 134/89 (BP Location: Left Arm, Patient Position: Sitting, Cuff Size: Normal)   Pulse (!) 54   Temp (!) 97.5 F (36.4 C) (Oral)   Ht 5' 9.02 (1.753 m)   Wt 158 lb 3.2 oz (71.8 kg)   SpO2 98%   BMI 23.35 kg/m    Subjective:    Patient ID: James Shaffer, male    DOB: [birth year], 59 y.o.   MRN: [id number]  HPI: James Shaffer is a 59 y.o. male  Chief Complaint  Patient presents with   office visit    5 month F/u. Patient stated there are some kind of bites all over (but mainly hip/waist area). Patient mowed his grass and noticed it right after. The bites are itchy, red, and whelps.    HYPERTENSION Denies side effects from medications. Hypertension status: controlled Satisfied with current treatment? yes Duration of hypertension: years BP monitoring frequency:  not checking BP range:  BP medication side effects:  no Medication compliance: excellent compliance Previous BP meds:amlodipine  and lisinopril  Aspirin: no Recurrent headaches: no Visual changes: no Palpitations: no Dyspnea: no Chest pain: no Lower extremity edema: no Dizzy/lightheaded: no  Patient states he has been having dry, itching, patching areas.  Started in the summer after he mowed his girlfriends grass.  He has been putting iverest on it and it resolves then comes back in a few weeks.  He doesn't have any areas right now.  Denies any changes to soaps, lotions detergents.    Relevant past medical, surgical, family and social history reviewed and updated as indicated. Interim medical history since our last visit reviewed. Allergies and medications reviewed and updated.  Review of Systems  Skin:        Dry, itching, patchy areas    Per HPI unless specifically indicated above     Objective:    BP 134/89 (BP Location: Left Arm, Patient Position: Sitting, Cuff Size: Normal)   Pulse (!) 54   Temp (!) 97.5 F (36.4 C) (Oral)   Ht 5' 9.02 (1.753 m)   Wt 158 lb 3.2 oz (71.8 kg)   SpO2 98%   BMI  23.35 kg/m   Wt Readings from Last 3 Encounters:  09/17/24 158 lb 3.2 oz (71.8 kg)  04/15/24 150 lb 6.4 oz (68.2 kg)  02/25/24 150 lb (68 kg)    Physical Exam Vitals and nursing note reviewed.  Constitutional:      General: He is not in acute distress.    Appearance: Normal appearance. He is not ill-appearing, toxic-appearing or diaphoretic.  HENT:     Head: Normocephalic.     Right Ear: External ear normal.     Left Ear: External ear normal.     Nose: Nose normal. No congestion or rhinorrhea.     Mouth/Throat:     Mouth: Mucous membranes are moist.  Eyes:     General:        Right eye: No discharge.        Left eye: No discharge.     Extraocular Movements: Extraocular movements intact.     Conjunctiva/sclera: Conjunctivae normal.     Pupils: Pupils are equal, round, and reactive to light.  Cardiovascular:     Rate and Rhythm: Normal rate and regular rhythm.     Heart sounds: No murmur heard. Pulmonary:     Effort: Pulmonary effort is normal. No respiratory distress.     Breath sounds: Normal breath sounds. No wheezing, rhonchi or rales.  Abdominal:  General: Abdomen is flat. Bowel sounds are normal.  Musculoskeletal:     Cervical back: Normal range of motion and neck supple.  Skin:    General: Skin is warm and dry.     Capillary Refill: Capillary refill takes less than 2 seconds.  Neurological:     General: No focal deficit present.     Mental Status: He is alert and oriented to person, place, and time.  Psychiatric:        Mood and Affect: Mood normal.        Behavior: Behavior normal.        Thought Content: Thought content normal.        Judgment: Judgment normal.     Results for orders placed or performed in visit on 04/15/24  Comp Met (CMET)   Collection Time: 04/15/24 10:11 AM  Result Value Ref Range   Glucose 74 70 - 99 mg/dL   BUN 17 6 - 24 mg/dL   Creatinine, Ser 8.82 0.76 - 1.27 mg/dL   eGFR 72 >40 fO/fpw/8.26   BUN/Creatinine Ratio 15 9 - 20    Sodium 142 134 - 144 mmol/L   Potassium 4.4 3.5 - 5.2 mmol/L   Chloride 102 96 - 106 mmol/L   CO2 22 20 - 29 mmol/L   Calcium 9.7 8.7 - 10.2 mg/dL   Total Protein 7.2 6.0 - 8.5 g/dL   Albumin 4.4 3.8 - 4.9 g/dL   Globulin, Total 2.8 1.5 - 4.5 g/dL   Bilirubin Total 0.6 0.0 - 1.2 mg/dL   Alkaline Phosphatase 88 44 - 121 IU/L   AST 42 (H) 0 - 40 IU/L   ALT 37 0 - 44 IU/L      Assessment & Plan:   Problem List Items Addressed This Visit       Cardiovascular and Mediastinum   Essential hypertension - Primary   Chronic.  Controlled.  Continue with current medication regimen of Amlodipine  and Olmesartan .  Refills sent today.  Labs ordered today.  Return to clinic in 6 months for reevaluation.  Call sooner if concerns arise.         Other   Elevated glucose   Chronic.  Controlled.  Continue with current medication regimen.  Labs ordered today.  Return to clinic in 6 months for reevaluation.  Call sooner if concerns arise.          Follow up plan: No follow-ups on file.      "

## 2024-09-17 NOTE — Assessment & Plan Note (Signed)
 Chronic.  Controlled.  Continue with current medication regimen.  Labs ordered today.  Return to clinic in 6 months for reevaluation.  Call sooner if concerns arise.  ? ?

## 2024-09-18 ENCOUNTER — Ambulatory Visit: Payer: Self-pay | Admitting: Nurse Practitioner

## 2024-09-18 LAB — HEMOGLOBIN A1C
Est. average glucose Bld gHb Est-mCnc: 94 mg/dL
Hgb A1c MFr Bld: 4.9 % (ref 4.8–5.6)

## 2024-09-18 LAB — COMPREHENSIVE METABOLIC PANEL WITH GFR
ALT: 29 IU/L (ref 0–44)
AST: 29 IU/L (ref 0–40)
Albumin: 4.5 g/dL (ref 3.8–4.9)
Alkaline Phosphatase: 95 IU/L (ref 47–123)
BUN/Creatinine Ratio: 17 (ref 9–20)
BUN: 18 mg/dL (ref 6–24)
Bilirubin Total: 0.3 mg/dL (ref 0.0–1.2)
CO2: 20 mmol/L (ref 20–29)
Calcium: 9.9 mg/dL (ref 8.7–10.2)
Chloride: 100 mmol/L (ref 96–106)
Creatinine, Ser: 1.06 mg/dL (ref 0.76–1.27)
Globulin, Total: 3.1 g/dL (ref 1.5–4.5)
Glucose: 73 mg/dL (ref 70–99)
Potassium: 4.2 mmol/L (ref 3.5–5.2)
Sodium: 139 mmol/L (ref 134–144)
Total Protein: 7.6 g/dL (ref 6.0–8.5)
eGFR: 81 mL/min/1.73

## 2025-03-23 ENCOUNTER — Encounter: Admitting: Nurse Practitioner
